# Patient Record
Sex: Male | Born: 1980 | Race: Black or African American | Hispanic: No | Smoking: Former smoker
Health system: Southern US, Community
[De-identification: ages and names within clinical notes are randomized; demographics above are authoritative.]

## PROBLEM LIST (undated history)

## (undated) DIAGNOSIS — J9383 Other pneumothorax: Secondary | ICD-10-CM

## (undated) DIAGNOSIS — J939 Pneumothorax, unspecified: Secondary | ICD-10-CM

## (undated) DIAGNOSIS — K219 Gastro-esophageal reflux disease without esophagitis: Secondary | ICD-10-CM

## (undated) HISTORY — PX: FRACTURE SURGERY: SHX138

## (undated) HISTORY — PX: MANDIBLE FRACTURE SURGERY: SHX706

---
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
## 2000-04-01 ENCOUNTER — Emergency Department (HOSPITAL_COMMUNITY): Admission: EM | Admit: 2000-04-01 | Discharge: 2000-04-01 | Payer: Self-pay | Admitting: Emergency Medicine

## 2000-04-20 ENCOUNTER — Emergency Department (HOSPITAL_COMMUNITY): Admission: EM | Admit: 2000-04-20 | Discharge: 2000-04-20 | Payer: Self-pay | Admitting: Emergency Medicine

## 2000-04-29 ENCOUNTER — Emergency Department (HOSPITAL_COMMUNITY): Admission: EM | Admit: 2000-04-29 | Discharge: 2000-04-29 | Payer: Self-pay | Admitting: Emergency Medicine

## 2000-08-06 ENCOUNTER — Emergency Department (HOSPITAL_COMMUNITY): Admission: EM | Admit: 2000-08-06 | Discharge: 2000-08-06 | Payer: Self-pay | Admitting: Emergency Medicine

## 2002-11-13 ENCOUNTER — Emergency Department (HOSPITAL_COMMUNITY): Admission: EM | Admit: 2002-11-13 | Discharge: 2002-11-13 | Payer: Self-pay | Admitting: Emergency Medicine

## 2003-02-20 ENCOUNTER — Emergency Department (HOSPITAL_COMMUNITY): Admission: EM | Admit: 2003-02-20 | Discharge: 2003-02-20 | Payer: Self-pay | Admitting: Emergency Medicine

## 2003-02-20 ENCOUNTER — Encounter: Payer: Self-pay | Admitting: Emergency Medicine

## 2005-04-17 ENCOUNTER — Emergency Department (HOSPITAL_COMMUNITY): Admission: EM | Admit: 2005-04-17 | Discharge: 2005-04-17 | Payer: Self-pay | Admitting: Emergency Medicine

## 2005-06-14 ENCOUNTER — Emergency Department (HOSPITAL_COMMUNITY): Admission: EM | Admit: 2005-06-14 | Discharge: 2005-06-15 | Payer: Self-pay | Admitting: Emergency Medicine

## 2006-03-14 ENCOUNTER — Emergency Department (HOSPITAL_COMMUNITY): Admission: EM | Admit: 2006-03-14 | Discharge: 2006-03-15 | Payer: Self-pay | Admitting: Emergency Medicine

## 2006-04-16 ENCOUNTER — Emergency Department (HOSPITAL_COMMUNITY): Admission: EM | Admit: 2006-04-16 | Discharge: 2006-04-17 | Payer: Self-pay | Admitting: Emergency Medicine

## 2007-10-29 ENCOUNTER — Emergency Department (HOSPITAL_COMMUNITY): Admission: EM | Admit: 2007-10-29 | Discharge: 2007-10-29 | Payer: Self-pay | Admitting: Emergency Medicine

## 2008-03-09 ENCOUNTER — Emergency Department (HOSPITAL_COMMUNITY): Admission: EM | Admit: 2008-03-09 | Discharge: 2008-03-09 | Payer: Self-pay | Admitting: Emergency Medicine

## 2008-04-05 ENCOUNTER — Emergency Department (HOSPITAL_COMMUNITY): Admission: EM | Admit: 2008-04-05 | Discharge: 2008-04-05 | Payer: Self-pay | Admitting: Emergency Medicine

## 2008-12-06 ENCOUNTER — Emergency Department (HOSPITAL_COMMUNITY): Admission: EM | Admit: 2008-12-06 | Discharge: 2008-12-06 | Payer: Self-pay | Admitting: Emergency Medicine

## 2009-02-21 ENCOUNTER — Emergency Department (HOSPITAL_COMMUNITY): Admission: EM | Admit: 2009-02-21 | Discharge: 2009-02-21 | Payer: Self-pay | Admitting: Emergency Medicine

## 2009-05-05 ENCOUNTER — Emergency Department (HOSPITAL_COMMUNITY): Admission: EM | Admit: 2009-05-05 | Discharge: 2009-05-05 | Payer: Self-pay | Admitting: Emergency Medicine

## 2009-05-28 ENCOUNTER — Emergency Department (HOSPITAL_COMMUNITY): Admission: EM | Admit: 2009-05-28 | Discharge: 2009-05-29 | Payer: Self-pay | Admitting: Emergency Medicine

## 2009-06-30 ENCOUNTER — Ambulatory Visit (HOSPITAL_COMMUNITY): Admission: RE | Admit: 2009-06-30 | Discharge: 2009-06-30 | Payer: Self-pay | Admitting: Otolaryngology

## 2010-02-03 ENCOUNTER — Emergency Department (HOSPITAL_COMMUNITY): Admission: EM | Admit: 2010-02-03 | Discharge: 2010-02-03 | Payer: Self-pay | Admitting: Emergency Medicine

## 2010-04-03 ENCOUNTER — Emergency Department (HOSPITAL_COMMUNITY): Admission: EM | Admit: 2010-04-03 | Discharge: 2010-04-03 | Payer: Self-pay | Admitting: Emergency Medicine
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
## 2010-10-11 ENCOUNTER — Emergency Department (HOSPITAL_COMMUNITY): Admission: EM | Admit: 2010-10-11 | Discharge: 2010-10-11 | Payer: Self-pay | Admitting: Emergency Medicine

## 2010-10-24 ENCOUNTER — Emergency Department (HOSPITAL_COMMUNITY): Admission: EM | Admit: 2010-10-24 | Discharge: 2010-10-24 | Payer: Self-pay | Admitting: Emergency Medicine

## 2011-02-28 LAB — URINE CULTURE
Culture  Setup Time: 201111072152
Culture: NO GROWTH

## 2011-02-28 LAB — URINALYSIS, ROUTINE W REFLEX MICROSCOPIC
Bilirubin Urine: NEGATIVE
Glucose, UA: NEGATIVE mg/dL
Protein, ur: NEGATIVE mg/dL
Urobilinogen, UA: 0.2 mg/dL (ref 0.0–1.0)

## 2011-02-28 LAB — URINE MICROSCOPIC-ADD ON

## 2011-03-08 LAB — URINALYSIS, ROUTINE W REFLEX MICROSCOPIC
Glucose, UA: NEGATIVE mg/dL
Hgb urine dipstick: NEGATIVE
Ketones, ur: NEGATIVE mg/dL
Protein, ur: NEGATIVE mg/dL
pH: 7 (ref 5.0–8.0)

## 2011-03-08 LAB — RAPID URINE DRUG SCREEN, HOSP PERFORMED
Amphetamines: NOT DETECTED
Barbiturates: NOT DETECTED
Benzodiazepines: NOT DETECTED
Cocaine: POSITIVE — AB

## 2011-03-26 LAB — CBC
MCHC: 33.7 g/dL (ref 30.0–36.0)
Platelets: 159 10*3/uL (ref 150–400)
RDW: 13.9 % (ref 11.5–15.5)

## 2011-03-26 LAB — BASIC METABOLIC PANEL
CO2: 29 mEq/L (ref 19–32)
Calcium: 9.9 mg/dL (ref 8.4–10.5)
Creatinine, Ser: 1.11 mg/dL (ref 0.4–1.5)
GFR calc non Af Amer: >60 mL/min (ref >60)
Glucose, Bld: 83 mg/dL (ref 70–99)

## 2011-05-02 NOTE — Op Note (Signed)
NAMEShauna Price        ACCOUNT NO.:  [id number]   MEDICAL RECORD NO.:  [id number]          PATIENT TYPE:  AMB   LOCATION:  SDS                          FACILITY:  [facility]   PHYSICIAN:  Newman Pies, MD            DATE OF BIRTH:  08/18/1981   DATE OF PROCEDURE:  06/30/2009  DATE OF DISCHARGE:  06/30/2009                               OPERATIVE REPORT   SURGEON:  Newman Pies, MD   PREOPERATIVE DIAGNOSIS:  Mandibular fracture, status post mandibular  maxillary fixation.   POSTOPERATIVE DIAGNOSIS:  Mandibular fracture, status post mandibular  maxillary fixation.   PROCEDURE PERFORMED:  Removal of mandibular maxillary fixation screws  and wires.   ANESTHESIA:  Local anesthesia with IV conscious sedation.   COMPLICATIONS:  None.   ESTIMATED BLOOD LOSS:  Minimal.   INDICATIONS FOR PROCEDURE:  The patient is a 30 year old male with a  history of mandibular fracture approximately 5 weeks ago.  He  subsequently underwent maxillary mandibular fixation to treat his  mandibular fracture.  The patient is taken back to the operating room  today for removal of the MMF screws and wires.  The risks, benefits, and  details of the procedure were discussed with the patient.  Questions  were invited and answered.  Informed consent was obtained.   DESCRIPTION OF PROCEDURE:  The patient was taken to the operating room  and placed supine on the operating table.  IV conscious sedation was  administered by the anesthesiologist.  Lidocaine 1% with 1:100,000  epinephrine were injected into the soft tissue around 4 MMF screws.  The  MMF wires were subsequently cut and removed with a wire driver.  Bovie  electrocautery was used to remove the soft tissue over the MMF screws.  The screws were subsequently removed without difficulty.  Good dental  occlusion was noted.  That concluded procedure for the patient.  The  care of the patient was turned over to the anesthesiologist.  The  patient was awakened  from anesthesia without difficulty.  He was  transferred to the recovery room in good condition.   OPERATIVE FINDINGS:  The MMF screws and wires were noted to be in place.  Good dental occlusion was noted.   SPECIMEN:  None.   FOLLOWUP CARE:  The patient will be discharged home once he is awake and  alert.  He will follow up in my office on a p.r.n. basis.      Newman Pies, MD  Electronically Signed     ST/MEDQ  D:  06/30/2009  T:  07/01/2009  Job:  [id number]
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
## 2011-09-11 LAB — URINALYSIS, ROUTINE W REFLEX MICROSCOPIC
Bilirubin Urine: NEGATIVE
Glucose, UA: NEGATIVE
Ketones, ur: 15 — AB
Nitrite: NEGATIVE
Protein, ur: 30 — AB
Specific Gravity, Urine: 1.029
Urobilinogen, UA: 0.2
pH: 5.5

## 2011-09-11 LAB — URINE MICROSCOPIC-ADD ON

## 2011-09-11 LAB — POCT I-STAT, CHEM 8
Creatinine, Ser: 1.4
Glucose, Bld: 89
HCT: 50
Hemoglobin: 17
Sodium: 140
TCO2: 30

## 2011-09-11 LAB — CBC
HCT: 47.2
Hemoglobin: 15.5
MCHC: 32.9
MCV: 86.3
Platelets: 186
RBC: 5.47
RDW: 13.7
WBC: 7.6

## 2011-09-11 LAB — DIFFERENTIAL
Basophils Absolute: 0
Basophils Relative: 1
Eosinophils Absolute: 0.1
Eosinophils Relative: 1
Lymphocytes Relative: 31
Lymphs Abs: 2.4
Monocytes Absolute: 1.2 — ABNORMAL HIGH
Monocytes Relative: 15 — ABNORMAL HIGH
Neutro Abs: 3.9
Neutrophils Relative %: 52

## 2011-09-11 LAB — GC/CHLAMYDIA PROBE AMP, GENITAL: Chlamydia, DNA Probe: NEGATIVE

## 2011-09-21 LAB — HEPATIC FUNCTION PANEL
AST: 20 U/L (ref 0–37)
Albumin: 4.1 g/dL (ref 3.5–5.2)
Alkaline Phosphatase: 75 U/L (ref 39–117)
Total Bilirubin: 1.4 mg/dL — ABNORMAL HIGH (ref 0.3–1.2)

## 2011-09-21 LAB — POCT I-STAT, CHEM 8
BUN: 10 mg/dL (ref 6–23)
Calcium, Ion: 1.16 mmol/L (ref 1.12–1.32)
Creatinine, Ser: 1.2 mg/dL (ref 0.4–1.5)
Glucose, Bld: 87 mg/dL (ref 70–99)
TCO2: 27 mmol/L (ref 0–100)

## 2011-09-21 LAB — URINALYSIS, ROUTINE W REFLEX MICROSCOPIC
Bilirubin Urine: NEGATIVE
Glucose, UA: NEGATIVE mg/dL
Ketones, ur: NEGATIVE mg/dL
pH: 7 (ref 5.0–8.0)

## 2011-09-21 LAB — DIFFERENTIAL
Lymphocytes Relative: 15 % (ref 12–46)
Lymphs Abs: 2.3 10*3/uL (ref 0.7–4.0)
Monocytes Relative: 6 % (ref 3–12)
Neutro Abs: 11.5 10*3/uL — ABNORMAL HIGH (ref 1.7–7.7)
Neutrophils Relative %: 77 % (ref 43–77)

## 2011-09-21 LAB — CBC
Hemoglobin: 15.1 g/dL (ref 13.0–17.0)
MCV: 86.6 fL (ref 78.0–100.0)
Platelets: 207 10*3/uL (ref 150–400)

## 2011-09-21 LAB — LIPASE, BLOOD: Lipase: 18 U/L (ref 11–59)

## 2011-12-06 ENCOUNTER — Encounter: Payer: Self-pay | Admitting: Emergency Medicine

## 2011-12-06 ENCOUNTER — Emergency Department (HOSPITAL_COMMUNITY)
Admission: EM | Admit: 2011-12-06 | Discharge: 2011-12-07 | Disposition: A | Discharge status: Home / Self Care | Payer: Self-pay | Attending: Emergency Medicine | Admitting: Emergency Medicine

## 2011-12-06 DIAGNOSIS — J020 Streptococcal pharyngitis: Secondary | ICD-10-CM | POA: Insufficient documentation

## 2011-12-06 NOTE — ED Notes (Signed)
Pt. Received from triage, pt. Alert and oriented, ambulatory gait steady, NAD noted

## 2011-12-06 NOTE — ED Notes (Signed)
Pt c/o sore throat and elevated temp onset last pm

## 2011-12-07 MED ORDER — FENTANYL CITRATE 0.05 MG/ML IJ SOLN: 50.0000 ug | Freq: Once | INTRAMUSCULAR | Status: DC

## 2011-12-07 MED ORDER — IBUPROFEN 600 MG PO TABS: 600.0000 mg | ORAL_TABLET | Freq: Four times a day (QID) | ORAL | Status: AC | PRN

## 2011-12-07 MED ORDER — PENICILLIN G BENZATHINE 1200000 UNIT/2ML IM SUSP
1.2000 10*6.[IU] | INTRAMUSCULAR | Status: AC
  Administered 2011-12-07: 1.2 10*6.[IU] via INTRAMUSCULAR
  Filled 2011-12-07: qty 2

## 2011-12-07 NOTE — ED Provider Notes (Signed)
History     CSN: [id number] Arrival date & time: 12/06/2011  9:08 PM   First MD Initiated Contact with Patient 12/06/11 2320      Chief Complaint  Patient presents with  . Sore Throat    (Consider location/radiation/quality/duration/timing/severity/associated sxs/prior treatment) Patient is a 30 y.o. male presenting with pharyngitis. The history is provided by the patient. No language interpreter was used.  Sore Throat This is a new problem. The current episode started 2 days ago. The problem occurs constantly. The problem has not changed since onset.Pertinent negatives include no chest pain, no abdominal pain, no headaches and no shortness of breath. The symptoms are aggravated by nothing. The symptoms are relieved by nothing. He has tried nothing for the symptoms. The treatment provided no relief.  Exposed to people with strep throat.  No f/c/r.  No CP, SOB n/v/d.  No drooling no change in voice  History reviewed. No pertinent past medical history.  Past Surgical History  Procedure Date  . Mandible fracture surgery     No family history on file.  History  Substance Use Topics  . Smoking status: Passive Smoker    Types: Cigarettes  . Smokeless tobacco: Not on file  . Alcohol Use: Yes     occasional      Review of Systems  Constitutional: Negative for fever and activity change.  HENT: Positive for sore throat. Negative for rhinorrhea, drooling, neck pain, neck stiffness and voice change.   Eyes: Negative for discharge.  Respiratory: Negative for shortness of breath.   Cardiovascular: Negative for chest pain.  Gastrointestinal: Negative for abdominal pain and abdominal distention.  Genitourinary: Negative for difficulty urinating.  Musculoskeletal: Negative.   Skin: Negative.   Neurological: Negative for headaches.  Hematological: Negative.   Psychiatric/Behavioral: Negative.  Negative for agitation.    Allergies  Review of patient's allergies indicates no known  allergies.  Home Medications  No current outpatient prescriptions on file.  BP 126/81  Pulse 93  Temp(Src) 99.6 F (37.6 C) (Oral)  Resp 18  SpO2 98%  Physical Exam  Constitutional: He is oriented to person, place, and time. He appears well-developed and well-nourished. No distress.  HENT:  Head: Normocephalic and atraumatic.  Right Ear: Tympanic membrane is not injected.  Left Ear: Tympanic membrane is not injected.  Nose: Nose normal.  Mouth/Throat: Oropharyngeal exudate present.  Eyes: EOM are normal. Pupils are equal, round, and reactive to light.  Neck: Normal range of motion. Neck supple. No thyromegaly present.  Cardiovascular: Normal rate.   Pulmonary/Chest: Effort normal and breath sounds normal.  Abdominal: Soft. Bowel sounds are normal. There is no tenderness. There is no rebound.  Musculoskeletal: Normal range of motion.  Neurological: He is alert and oriented to person, place, and time.  Skin: Skin is warm and dry.  Psychiatric: Thought content normal.    ED Course  Procedures (including critical care time)  Labs Reviewed  RAPID STREP SCREEN - Abnormal; Notable for the following:    Streptococcus, Group A Screen (Direct) POSITIVE (*)    All other components within normal limits   No results found.   No diagnosis found.    MDM  Return for drooling change in voice inability to tolerate orals.        Jasmine Awe, MD 12/07/11 [phone]

## 2011-12-07 NOTE — ED Notes (Signed)
Pt. Discharged to home, pt. Alert and oriented, ambulatory gait steady, NAD noted

## 2012-04-30 ENCOUNTER — Encounter (HOSPITAL_COMMUNITY): Payer: Self-pay | Admitting: *Deleted

## 2012-04-30 ENCOUNTER — Emergency Department (HOSPITAL_COMMUNITY)
Admission: EM | Admit: 2012-04-30 | Discharge: 2012-04-30 | Disposition: A | Discharge status: Home / Self Care | Payer: Self-pay | Attending: Emergency Medicine | Admitting: Emergency Medicine

## 2012-04-30 DIAGNOSIS — L723 Sebaceous cyst: Secondary | ICD-10-CM | POA: Insufficient documentation

## 2012-04-30 DIAGNOSIS — R059 Cough, unspecified: Secondary | ICD-10-CM | POA: Insufficient documentation

## 2012-04-30 DIAGNOSIS — J3489 Other specified disorders of nose and nasal sinuses: Secondary | ICD-10-CM | POA: Insufficient documentation

## 2012-04-30 DIAGNOSIS — H9209 Otalgia, unspecified ear: Secondary | ICD-10-CM | POA: Insufficient documentation

## 2012-04-30 DIAGNOSIS — F17200 Nicotine dependence, unspecified, uncomplicated: Secondary | ICD-10-CM | POA: Insufficient documentation

## 2012-04-30 DIAGNOSIS — R05 Cough: Secondary | ICD-10-CM | POA: Insufficient documentation

## 2012-04-30 DIAGNOSIS — J029 Acute pharyngitis, unspecified: Secondary | ICD-10-CM | POA: Insufficient documentation

## 2012-04-30 DIAGNOSIS — J069 Acute upper respiratory infection, unspecified: Secondary | ICD-10-CM | POA: Insufficient documentation

## 2012-04-30 MED ORDER — PSEUDOEPHEDRINE HCL 60 MG PO TABS: 60.0000 mg | ORAL_TABLET | Freq: Once | ORAL | Status: AC

## 2012-04-30 MED ORDER — KETOROLAC TROMETHAMINE 30 MG/ML IJ SOLN
30.0000 mg | Freq: Once | INTRAMUSCULAR | Status: AC
  Administered 2012-04-30: 30 mg via INTRAMUSCULAR
  Filled 2012-04-30: qty 1

## 2012-04-30 MED ORDER — PSEUDOEPHEDRINE HCL 60 MG PO TABS
60.0000 mg | ORAL_TABLET | Freq: Once | ORAL | Status: AC
  Administered 2012-04-30: 60 mg via ORAL
  Filled 2012-04-30: qty 1

## 2012-04-30 NOTE — Discharge Instructions (Signed)
Saline Nose Drops  To help clear a stuffy nose, put salt water (saline) nose drops in your infant's nose. This helps to loosen the secretions in the nose. Use a bulb syringe to clean the nose out:  Before feeding.   Before putting your infant down for naps.   No more than once every 3 hours to avoid irritating your infant's nostrils.  HOME CARE  Buy nose drops at your local drug store. You can also make nose drops yourself. Mix 1 cup of water with  teaspoon of salt. Stir. Store this mixture at room temperature. Make a new batch daily.   To use the drops:   Put 1 or 2 drops in each side of infant's nose with a clean medicine dropper. Do not use this dropper for any other medicine.   Squeeze the air out of the suction bulb before inserting it into your infant's nose.   While still squeezing the bulb flat, place the tip of the bulb into a nostril. Let air come back into the bulb. The suction will pull snot out of the nose and into the bulb.   Repeat on other nostril.   Squeeze the bulb several times into a tissue and wash the bulb tip in soapy water. Store the bulb with the tip side down on paper towel.   Use the bulb syringe with only the saline drops to avoid irritating your infant's nostrils.  GET HELP RIGHT AWAY IF:  The snot changes to green or yellow.   The snot gets thicker.   Your infant is 3 months or younger with a rectal temperature of 100.4 F (38 C) or higher.   Your infant is older than 3 months with a rectal temperature of 102 F (38.9 C) or higher.   The stuffy nose lasts 10 days or longer.   There is trouble breathing or feeding.  MAKE SURE YOU:  Understand these instructions.   Will watch your infant's condition.   Will get help right away if your infant is not doing well or gets worse.  Document Released: 10/01/2009 Document Revised: 11/23/2011 Document Reviewed: 10/01/2009 [facility] Patient Information 2012 [place], [place]. You've been given a  prescription for a decongestant he can take Tylenol or ibuprofen alternating doses every 3-4 hours for fever or for

## 2012-04-30 NOTE — ED Notes (Addendum)
C/o nasal congestion, earaches, runny nose, sore throat, chest hurts, cough (non-productive), facial pressure, onset of sx last night. describes as cold sx. No relief with nyquil. (Denies: fever or nvd). Using kleenex and snorting to scratch throat. Alert, NAD, calm, interactive. LS CTA, throat mildly red and swollen, no obvious exudate.

## 2012-04-30 NOTE — ED Provider Notes (Signed)
History     CSN: [id number]  Arrival date & time 04/30/12  4098   First MD Initiated Contact with Patient 04/30/12 2021      Chief Complaint  Patient presents with  . Nasal Congestion    (Consider location/radiation/quality/duration/timing/severity/associated sxs/prior treatment) HPI Comments: Exercise last night with myalgias, sinus congestion, sore throat, nonproductive cough, earache, he is taken NyQuil without relief.  Denies any documented fever.  He also has a "bump on his left distal clavicle" for the last 5+ years.  That is now becoming painful  The history is provided by the patient.    History reviewed. No pertinent past medical history.  Past Surgical History  Procedure Date  . Mandible fracture surgery     No family history on file.  History  Substance Use Topics  . Smoking status: Current Some Day Smoker    Types: Cigarettes  . Smokeless tobacco: Not on file  . Alcohol Use: Yes     occasional      Review of Systems  Constitutional: Negative for fever and chills.  HENT: Positive for ear pain, congestion, sore throat and trouble swallowing. Negative for rhinorrhea and postnasal drip.   Respiratory: Positive for cough. Negative for shortness of breath and wheezing.   Skin: Positive for wound.  Neurological: Negative for dizziness and numbness.    Allergies  Review of patient's allergies indicates no known allergies.  Home Medications   Current Outpatient Rx  Name Route Sig Dispense Refill  . BENADRYL PO Oral Take 1 tablet by mouth daily.    Marland Kitchen PSEUDOEPHEDRINE HCL 60 MG PO TABS Oral Take 1 tablet (60 mg total) by mouth once. 30 tablet 0    BP 139/88  Pulse 94  Temp 99.3 F (37.4 C)  Resp 16  SpO2 96%  Physical Exam  Constitutional: He is oriented to person, place, and time. He appears well-developed and well-nourished.  HENT:  Head: Normocephalic.  Nose: No rhinorrhea. Right sinus exhibits maxillary sinus tenderness and frontal sinus  tenderness. Left sinus exhibits maxillary sinus tenderness and frontal sinus tenderness.  Eyes: Pupils are equal, round, and reactive to light.  Neck: Normal range of motion.  Cardiovascular: Normal rate.   Pulmonary/Chest: Effort normal. No respiratory distress. He has no wheezes.  Neurological: He is alert and oriented to person, place, and time.  Skin:       Small mobile, skin cyst at the apex of the left clavicle at the a.c. joint without surrounding erythema.  Not tender    ED Course  Procedures (including critical care time)  Labs Reviewed - No data to display No results found.   1. Upper respiratory tract infection       MDM  Upper respiratory tract infection        Arman Filter, NP 04/30/12 2107  Arman Filter, NP 05/01/12 [phone]

## 2012-05-03 NOTE — ED Provider Notes (Signed)
Medical screening examination/treatment/procedure(s) were performed by non-physician practitioner and as supervising physician I was immediately available for consultation/collaboration.   Leigh-Ann Evaan Tidwell, MD 05/03/12 0139 

## 2012-09-10 ENCOUNTER — Emergency Department (HOSPITAL_COMMUNITY)
Admission: EM | Admit: 2012-09-10 | Discharge: 2012-09-10 | Disposition: A | Discharge status: Home / Self Care | Payer: Self-pay | Attending: Emergency Medicine | Admitting: Emergency Medicine

## 2012-09-10 ENCOUNTER — Encounter (HOSPITAL_COMMUNITY): Payer: Self-pay | Admitting: Emergency Medicine

## 2012-09-10 DIAGNOSIS — Y93E1 Activity, personal bathing and showering: Secondary | ICD-10-CM | POA: Insufficient documentation

## 2012-09-10 DIAGNOSIS — Z91018 Allergy to other foods: Secondary | ICD-10-CM | POA: Insufficient documentation

## 2012-09-10 DIAGNOSIS — F17200 Nicotine dependence, unspecified, uncomplicated: Secondary | ICD-10-CM | POA: Insufficient documentation

## 2012-09-10 DIAGNOSIS — S0100XA Unspecified open wound of scalp, initial encounter: Secondary | ICD-10-CM

## 2012-09-10 DIAGNOSIS — IMO0002 Reserved for concepts with insufficient information to code with codable children: Secondary | ICD-10-CM | POA: Insufficient documentation
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
## 2012-09-10 NOTE — Discharge Instructions (Signed)
 Your laceration is too small to require stitches,  Keep clean with soap and water is adequate to help prevent infection.

## 2012-09-10 NOTE — ED Notes (Signed)
Pt has small head lac with no bleeding.  No LOC.  Pt denies any problems at this time.

## 2012-09-10 NOTE — ED Notes (Signed)
Pt was leaning into tub and hit head on facet. Pt has small laceration to top of head. Pt denies LOC. Pt AAOx 4.

## 2012-11-19 ENCOUNTER — Emergency Department (HOSPITAL_COMMUNITY)
Admission: EM | Admit: 2012-11-19 | Discharge: 2012-11-19 | Disposition: A | Discharge status: Home / Self Care | Payer: Self-pay | Attending: Emergency Medicine | Admitting: Emergency Medicine

## 2012-11-19 ENCOUNTER — Encounter (HOSPITAL_COMMUNITY): Payer: Self-pay | Admitting: *Deleted

## 2012-11-19 DIAGNOSIS — F17200 Nicotine dependence, unspecified, uncomplicated: Secondary | ICD-10-CM | POA: Insufficient documentation

## 2012-11-19 DIAGNOSIS — R300 Dysuria: Secondary | ICD-10-CM | POA: Insufficient documentation

## 2012-11-19 DIAGNOSIS — R369 Urethral discharge, unspecified: Secondary | ICD-10-CM | POA: Insufficient documentation

## 2012-11-19 DIAGNOSIS — Z8619 Personal history of other infectious and parasitic diseases: Secondary | ICD-10-CM | POA: Insufficient documentation

## 2012-11-19 LAB — URINALYSIS, ROUTINE W REFLEX MICROSCOPIC
Bilirubin Urine: NEGATIVE
Glucose, UA: NEGATIVE mg/dL
Hgb urine dipstick: NEGATIVE
Ketones, ur: NEGATIVE mg/dL
pH: 6 (ref 5.0–8.0)

## 2012-11-19 LAB — URINE MICROSCOPIC-ADD ON

## 2012-11-19 MED ORDER — AZITHROMYCIN 250 MG PO TABS
1000.0000 mg | ORAL_TABLET | Freq: Once | ORAL | Status: AC
  Administered 2012-11-19: 1000 mg via ORAL
  Filled 2012-11-19: qty 4

## 2012-11-19 MED ORDER — LIDOCAINE HCL (PF) 1 % IJ SOLN
INTRAMUSCULAR | Status: AC
  Administered 2012-11-19: 0.9 mL
  Filled 2012-11-19: qty 5

## 2012-11-19 MED ORDER — CEFTRIAXONE SODIUM 250 MG IJ SOLR
250.0000 mg | Freq: Once | INTRAMUSCULAR | Status: AC
  Administered 2012-11-19: 250 mg via INTRAMUSCULAR
  Filled 2012-11-19: qty 250

## 2012-11-19 NOTE — ED Notes (Signed)
Pt is here with lower back pain and feeling funny around private area.  Penile discharge beige in color.  Pain with urination

## 2012-11-19 NOTE — ED Notes (Signed)
Pt left before given d/c papers and did not sign.

## 2012-11-19 NOTE — ED Provider Notes (Signed)
History    This chart was scribed for Gerhard Munch, MD, MD by Smitty Pluck, ED Scribe. The patient was seen in room TR09C and the patient's care was started at 11:24AM.   CSN: [id number]  Arrival date & time 11/19/12  1021       Chief Complaint  Patient presents with  . Back Pain    (Consider location/radiation/quality/duration/timing/severity/associated sxs/prior treatment) The history is provided by the patient. No language interpreter was used.   Colton Price is a 31 y.o. male who presents to the Emergency Department complaining of constant, moderate dysuria onset 1 day ago. Pt reports that he has beige discharge from penis and intermittent moderate lower back pain onset 3 days ago. He denies injury to back and lifting heavy objects. He reports having gonorrheal infection years ago. He denies multiple sexual partners. Pt denies fever, chills, nausea, vomiting, abdominal pain, headache, SOB, chest pain and any other symptoms.    History reviewed. No pertinent past medical history.  Past Surgical History  Procedure Date  . Mandible fracture surgery     No family history on file.  History  Substance Use Topics  . Smoking status: Current Some Day Smoker    Types: Cigarettes  . Smokeless tobacco: Not on file  . Alcohol Use: Yes     Comment: occasional      Review of Systems  Constitutional:       Per HPI, otherwise negative  HENT:       Per HPI, otherwise negative  Eyes: Negative.   Respiratory:       Per HPI, otherwise negative  Cardiovascular:       Per HPI, otherwise negative  Gastrointestinal: Negative for vomiting.  Genitourinary: Negative.   Musculoskeletal:       Per HPI, otherwise negative  Skin: Negative.   Neurological: Negative for syncope.    Allergies  Fish allergy and Fruit & vegetable daily  Home Medications   Current Outpatient Rx  Name  Route  Sig  Dispense  Refill  . ASPIRIN EC 81 MG PO TBEC   Oral   Take 162 mg by mouth  daily as needed. For headache           BP 126/76  Pulse 69  Temp 97.3 F (36.3 C) (Oral)  Resp 18  SpO2 98%  Physical Exam  Nursing note and vitals reviewed. Constitutional: He is oriented to person, place, and time. He appears well-developed and well-nourished. No distress.  HENT:  Head: Normocephalic and atraumatic.  Eyes: EOM are normal.  Neck: Neck supple. No tracheal deviation present.  Cardiovascular: Normal rate, regular rhythm and normal heart sounds.   Pulmonary/Chest: Effort normal and breath sounds normal. No respiratory distress.  Genitourinary:       Whitish discharge    Musculoskeletal: Normal range of motion.       No back tenderness to palpation   Lymphadenopathy:       Right: Inguinal adenopathy present.       Left: Inguinal adenopathy present.  Neurological: He is alert and oriented to person, place, and time.  Skin: Skin is warm and dry.  Psychiatric: He has a normal mood and affect. His behavior is normal.    ED Course  Procedures (including critical care time) DIAGNOSTIC STUDIES:   COORDINATION OF CARE: 11:30 AM Discussed ED treatment with pt     Labs Reviewed  URINALYSIS, ROUTINE W REFLEX MICROSCOPIC - Abnormal; Notable for the following:    APPearance CLOUDY (*)  Leukocytes, UA MODERATE (*)     All other components within normal limits  URINE MICROSCOPIC-ADD ON   No results found.   No diagnosis found.    MDM  I personally performed the services described in this documentation, which was scribed in my presence. The recorded information has been reviewed and is accurate.  This generally well-appearing young male presents with concerns of back pain, urethral discharge, dysuria.  UA positive for leukocytes. Given this history he was treated empirically for gonorrhea and chlamydia.  A culture was sent.  Absent concerning VS changes, distress, he was appropriate for D/C.  He was counselled on the need to have his partner evaluated /  treated as well.   Gerhard Munch, MD 11/19/12 1145

## 2012-11-19 NOTE — ED Notes (Signed)
Left before d/c paperwork given and did not sign.

## 2012-11-20 LAB — GC/CHLAMYDIA PROBE AMP
CT Probe RNA: POSITIVE — AB
GC Probe RNA: POSITIVE — AB

## 2012-11-23 ENCOUNTER — Telehealth (HOSPITAL_COMMUNITY): Payer: Self-pay | Admitting: Emergency Medicine

## 2012-11-23 NOTE — ED Notes (Signed)
+  Gonorrhea. +Chlamydia. Patient treated with Rocephin and Zithromax. Per protocol MD. [facility] faxed. °

## 2012-11-24 ENCOUNTER — Telehealth (HOSPITAL_COMMUNITY): Payer: Self-pay | Admitting: Emergency Medicine

## 2012-11-24 NOTE — ED Notes (Signed)
Unable to contact patient via phone. Sent letter. °

## 2012-12-08 NOTE — ED Notes (Signed)
Patient treated per protocol. Chart sent to Medical Records. °

## 2013-04-05 ENCOUNTER — Emergency Department (HOSPITAL_COMMUNITY)
Admission: EM | Admit: 2013-04-05 | Discharge: 2013-04-05 | Disposition: A | Discharge status: Home / Self Care | Payer: Self-pay | Attending: Emergency Medicine | Admitting: Emergency Medicine

## 2013-04-05 ENCOUNTER — Encounter (HOSPITAL_COMMUNITY): Payer: Self-pay | Admitting: Neurology

## 2013-04-05 DIAGNOSIS — Z8719 Personal history of other diseases of the digestive system: Secondary | ICD-10-CM | POA: Insufficient documentation

## 2013-04-05 DIAGNOSIS — R110 Nausea: Secondary | ICD-10-CM | POA: Insufficient documentation

## 2013-04-05 DIAGNOSIS — F17200 Nicotine dependence, unspecified, uncomplicated: Secondary | ICD-10-CM | POA: Insufficient documentation

## 2013-04-05 DIAGNOSIS — J029 Acute pharyngitis, unspecified: Secondary | ICD-10-CM | POA: Insufficient documentation

## 2013-04-05 DIAGNOSIS — M542 Cervicalgia: Secondary | ICD-10-CM | POA: Insufficient documentation

## 2013-04-05 DIAGNOSIS — R1310 Dysphagia, unspecified: Secondary | ICD-10-CM | POA: Insufficient documentation

## 2013-04-05 DIAGNOSIS — J020 Streptococcal pharyngitis: Secondary | ICD-10-CM

## 2013-04-05 HISTORY — DX: Gastro-esophageal reflux disease without esophagitis: K21.9

## 2013-04-05 MED ORDER — ONDANSETRON HCL 4 MG PO TABS: 4.0000 mg | ORAL_TABLET | Freq: Four times a day (QID) | ORAL | Status: DC

## 2013-04-05 MED ORDER — HYDROCODONE-ACETAMINOPHEN 7.5-325 MG/15ML PO SOLN: 15.0000 mL | Freq: Four times a day (QID) | ORAL | Status: DC | PRN

## 2013-04-05 MED ORDER — PENICILLIN G BENZATHINE 1200000 UNIT/2ML IM SUSP
1.2000 10*6.[IU] | Freq: Once | INTRAMUSCULAR | Status: AC
  Administered 2013-04-05: 1.2 10*6.[IU] via INTRAMUSCULAR
  Filled 2013-04-05: qty 2

## 2013-04-05 MED ORDER — HYDROCODONE-ACETAMINOPHEN 7.5-325 MG/15ML PO SOLN
10.0000 mL | Freq: Once | ORAL | Status: DC
  Filled 2013-04-05: qty 15

## 2013-04-05 NOTE — ED Notes (Addendum)
Pt stating sore throat x 2 days, nausea since yesterday. Airway intact. Reports pain with swallowing and diminished appetite. A x 4. Reporting took 2 excedrin today. Denies v/d.

## 2013-04-05 NOTE — ED Provider Notes (Signed)
History     CSN: [id number]  Arrival date & time 04/05/13  1311   First MD Initiated Contact with Patient 04/05/13 1444      Chief Complaint  Patient presents with  . Sore Throat  . Nausea    (Consider location/radiation/quality/duration/timing/severity/associated sxs/prior treatment) HPI  32 year old male presents complaining of sore throat. Patient reports gradual onset of soreness to the right side of his throat ongoing for the past 2 days. Pain has been persistent, 8/10, worse with swallowing, with associate nausea without vomiting or diarrhea. He denies any fever, chills, runny nose, sneezing, cough, chest pain, shortness of breath, abdominal pain, or rash. He has prior history of strep test and this felt similar. No specific treatment at home. Able to swallow his saliva.  Past Medical History  Diagnosis Date  . GERD (gastroesophageal reflux disease)     Past Surgical History  Procedure Laterality Date  . Mandible fracture surgery      No family history on file.  History  Substance Use Topics  . Smoking status: Current Some Day Smoker    Types: Cigarettes  . Smokeless tobacco: Not on file  . Alcohol Use: Yes     Comment: occasional      Review of Systems  Constitutional: Negative for fever and chills.  HENT: Positive for sore throat, trouble swallowing and neck pain. Negative for ear pain, congestion, sneezing, dental problem and voice change.   Skin: Negative for rash.  Neurological: Negative for headaches.    Allergies  Fish allergy and Fruit & vegetable daily  Home Medications   Current Outpatient Rx  Name  Route  Sig  Dispense  Refill  . Aspirin-Acetaminophen-Caffeine (EXCEDRIN PO)   Oral   Take 2 tablets by mouth daily as needed. For pain           BP 124/82  Pulse 83  Temp(Src) 98.5 F (36.9 C) (Oral)  Resp 20  SpO2 96%  Physical Exam  Nursing note and vitals reviewed. Constitutional: He appears well-developed and well-nourished. No  distress.  HENT:  Head: Atraumatic.  Right Ear: External ear normal.  Left Ear: External ear normal.  Mouth/Throat: Uvula is midline and oropharynx is clear and moist.  Uvula midline  Bilateral tonsillar enlargement with exudates, R>L No obvious evidence of PTA, Ludwigs angina or other deep tissue infection.    Neck: Normal range of motion. Neck supple.  Lymphadenopathy:    He has cervical adenopathy.  Neurological: He is alert.  Skin: Skin is warm. No rash noted.    ED Course  Procedures (including critical care time)  3:41 PM Pt presents with sore throat.  Strep is positive.  No evidence of peritonsillar abscess or other evidence of deep tissue infection.  abx given, pain medication given, pt able to tolerates PO.  ENT referral as needed.  Return precaution discussed.   Labs Reviewed  RAPID STREP SCREEN - Abnormal; Notable for the following:    Streptococcus, Group A Screen (Direct) POSITIVE (*)    All other components within normal limits   No results found.   1. Strep pharyngitis       MDM  BP 130/86  Pulse 67  Temp(Src) 98.5 F (36.9 C) (Oral)  Resp 16  SpO2 100%  I have reviewed nursing notes and vital signs.  I reviewed available ER/hospitalization records thought the EMR         Fayrene Helper, [place] 04/05/13 1543

## 2013-04-11 NOTE — ED Provider Notes (Signed)
Medical screening examination/treatment/procedure(s) were performed by non-physician practitioner and as supervising physician I was immediately available for consultation/collaboration.  Damariz Paganelli M Mlissa Tamayo, MD 04/11/13 0021 

## 2013-06-22 ENCOUNTER — Encounter (HOSPITAL_COMMUNITY): Payer: Self-pay | Admitting: Emergency Medicine

## 2013-06-22 ENCOUNTER — Emergency Department (HOSPITAL_COMMUNITY)
Admission: EM | Admit: 2013-06-22 | Discharge: 2013-06-22 | Disposition: A | Discharge status: Home / Self Care | Payer: Self-pay | Attending: Emergency Medicine | Admitting: Emergency Medicine

## 2013-06-22 DIAGNOSIS — D1739 Benign lipomatous neoplasm of skin and subcutaneous tissue of other sites: Secondary | ICD-10-CM | POA: Insufficient documentation

## 2013-06-22 DIAGNOSIS — Z8719 Personal history of other diseases of the digestive system: Secondary | ICD-10-CM | POA: Insufficient documentation

## 2013-06-22 DIAGNOSIS — L02811 Cutaneous abscess of head [any part, except face]: Secondary | ICD-10-CM

## 2013-06-22 DIAGNOSIS — M542 Cervicalgia: Secondary | ICD-10-CM | POA: Insufficient documentation

## 2013-06-22 DIAGNOSIS — Z8781 Personal history of (healed) traumatic fracture: Secondary | ICD-10-CM | POA: Insufficient documentation

## 2013-06-22 DIAGNOSIS — L02818 Cutaneous abscess of other sites: Secondary | ICD-10-CM | POA: Insufficient documentation

## 2013-06-22 DIAGNOSIS — F17200 Nicotine dependence, unspecified, uncomplicated: Secondary | ICD-10-CM | POA: Insufficient documentation

## 2013-06-22 NOTE — ED Provider Notes (Signed)
History  This chart was scribed for non-physician practitioner working with [name]. [name], MD by Greggory Stallion, ED scribe. This patient was seen in room TR10C/TR10C and the patient's care was started at 8:13 PM.  CSN: [id number] Arrival date & time 06/22/13  1945   Chief Complaint  Patient presents with  . Abscess   The history is provided by the patient. No language interpreter was used.    HPI Comments: Colton Price is a 32 y.o. male who presents to the Emergency Department complaining of an abscess to the back of his scalp that started a one week ago. He states the pain from it radiated to his neck. Pt states there has been slight drainage for several days. Pt states history of previous cysts. Right upper back not a cyst, but a soft fluctuant mass presenting as a lipoma. Pt denies fever and emesis as associated symptoms.   Past Medical History  Diagnosis Date  . GERD (gastroesophageal reflux disease)    Past Surgical History  Procedure Laterality Date  . Mandible fracture surgery     No family history on file. History  Substance Use Topics  . Smoking status: Current Some Day Smoker    Types: Cigarettes  . Smokeless tobacco: Not on file  . Alcohol Use: Yes     Comment: occasional    Review of Systems  Constitutional: Negative for diaphoresis.  HENT: Positive for neck pain. Negative for neck stiffness.   Eyes: Negative for visual disturbance.  Respiratory: Negative for apnea, chest tightness and shortness of breath.   Cardiovascular: Negative for chest pain and palpitations.  Gastrointestinal: Negative for diarrhea and constipation.  Genitourinary: Negative for dysuria.  Musculoskeletal: Negative for gait problem.  Skin:       Cyst to back of head, lipoma to right scapula  Neurological: Negative for dizziness, weakness, light-headedness and numbness.    Allergies  Fish allergy and Fruit & vegetable daily  Home Medications   Current Outpatient Rx   Name  Route  Sig  Dispense  Refill  . Aspirin-Acetaminophen-Caffeine (EXCEDRIN PO)   Oral   Take 2 tablets by mouth daily as needed. For pain         . HYDROcodone-acetaminophen (HYCET) 7.5-325 mg/15 ml solution   Oral   Take 15 mLs by mouth 4 (four) times daily as needed for pain.   120 mL   0   . ondansetron (ZOFRAN) 4 MG tablet   Oral   Take 1 tablet (4 mg total) by mouth every 6 (six) hours.   12 tablet   0    BP 130/77  Pulse 71  Temp(Src) 97.1 F (36.2 C) (Oral)  Resp 18  SpO2 100%  Physical Exam  Nursing note and vitals reviewed. Constitutional: He is oriented to person, place, and time. He appears well-developed and well-nourished. No distress.  HENT:  Head: Normocephalic and atraumatic.  Eyes: Conjunctivae and EOM are normal.  Neck: Normal range of motion. Neck supple.  No meningeal signs  Cardiovascular: Normal rate, regular rhythm and normal heart sounds.  Exam reveals no gallop and no friction rub.   No murmur heard. Pulmonary/Chest: Effort normal and breath sounds normal. No respiratory distress. He has no wheezes. He has no rales. He exhibits no tenderness.  Abdominal: Soft. Bowel sounds are normal. He exhibits no distension. There is no tenderness. There is no rebound and no guarding.  Musculoskeletal: Normal range of motion. He exhibits no edema and no tenderness.  Neurological: He  is alert and oriented to person, place, and time. No cranial nerve deficit.  Skin: Skin is warm and dry. He is not diaphoretic. No erythema.  1 cm lipoma to right scapula, 1 cm abscess to right occiput, no erythema, no warmth, tender to palpation. No sign of drainage.   Psychiatric: He has a normal mood and affect.    ED Course  Procedures (including critical care time)  DIAGNOSTIC STUDIES: Oxygen Saturation is 100% on RA, normal by my interpretation.    COORDINATION OF CARE: 8:42 PM-Discussed treatment plan which includes IND with pt at bedside and pt agreed to plan.  Advised pt to follow up with dermatology for the abscess on his right flank.   INCISION AND DRAINAGE Performed by: Glade Nurse, PA-C Consent: Verbal consent obtained. Risks and benefits: risks, benefits and alternatives were discussed Type: abscess  Body area: right occiput   Anesthesia: local infiltration  Incision was made with a scalpel.  Local anesthetic: lidocaine 2% with epinephrine  Anesthetic total: 1 ml  Complexity: simple Blunt dissection to break up loculations  Drainage: purulent  Drainage amount: minimal  Packing material: none  Patient tolerance: Patient tolerated the procedure well with no immediate complications.     Labs Reviewed - No data to display No results found. 1. Abscess of head     MDM  Patient with skin abscess amenable to incision and drainage.  Abscess was not large enough to warrant packing or drain,  wound recheck in 2 days. Encouraged home warm soaks and flushing.  No signs of cellulitis. No abx warranted. Provided pt with resource list to establish relationship with primary care doctor. Discussed reasons to seek immediate care. Patient expresses understanding and agrees with plan.  I personally performed the services described in this documentation, which was scribed in my presence. The recorded information has been reviewed and is accurate.    Glade Nurse, PA-C 06/24/13 0001

## 2013-06-22 NOTE — ED Notes (Signed)
PT. REPORTS ABSCESS AT BACK SCALP AND RIGHT UPPER BACK WITH SLIGHT DRAINAGE FOR SEVERAL DAYS .

## 2013-06-24 NOTE — ED Provider Notes (Signed)
Medical screening examination/treatment/procedure(s) were performed by non-physician practitioner and as supervising physician I was immediately available for consultation/collaboration.  Deserea Bordley R. Lucah Petta, MD 06/24/13 0739 

## 2014-07-27 ENCOUNTER — Emergency Department (HOSPITAL_COMMUNITY)
Admission: EM | Admit: 2014-07-27 | Discharge: 2014-07-27 | Disposition: A | Discharge status: Home / Self Care | Payer: Self-pay | Attending: Emergency Medicine | Admitting: Emergency Medicine

## 2014-07-27 ENCOUNTER — Emergency Department (HOSPITAL_COMMUNITY): Payer: Self-pay

## 2014-07-27 ENCOUNTER — Encounter (HOSPITAL_COMMUNITY): Payer: Self-pay | Admitting: Emergency Medicine

## 2014-07-27 DIAGNOSIS — J020 Streptococcal pharyngitis: Secondary | ICD-10-CM | POA: Insufficient documentation

## 2014-07-27 DIAGNOSIS — F17200 Nicotine dependence, unspecified, uncomplicated: Secondary | ICD-10-CM | POA: Insufficient documentation

## 2014-07-27 DIAGNOSIS — Z8719 Personal history of other diseases of the digestive system: Secondary | ICD-10-CM | POA: Insufficient documentation

## 2014-07-27 DIAGNOSIS — J029 Acute pharyngitis, unspecified: Secondary | ICD-10-CM | POA: Insufficient documentation

## 2014-07-27 DIAGNOSIS — H9209 Otalgia, unspecified ear: Secondary | ICD-10-CM | POA: Insufficient documentation

## 2014-07-27 LAB — RAPID STREP SCREEN (MED CTR MEBANE ONLY): STREPTOCOCCUS, GROUP A SCREEN (DIRECT): POSITIVE — AB

## 2014-07-27 MED ORDER — ACETAMINOPHEN 325 MG PO TABS
650.0000 mg | ORAL_TABLET | Freq: Four times a day (QID) | ORAL | Status: DC | PRN
  Administered 2014-07-27: 650 mg via ORAL

## 2014-07-27 MED ORDER — PENICILLIN G BENZATHINE 1200000 UNIT/2ML IM SUSP
1.2000 10*6.[IU] | Freq: Once | INTRAMUSCULAR | Status: AC
  Administered 2014-07-27: 1.2 10*6.[IU] via INTRAMUSCULAR
  Filled 2014-07-27: qty 2

## 2014-07-27 MED ORDER — DEXAMETHASONE SODIUM PHOSPHATE 10 MG/ML IJ SOLN
10.0000 mg | Freq: Once | INTRAMUSCULAR | Status: DC
Start: 2014-07-27 — End: 2014-07-27
  Filled 2014-07-27: qty 1

## 2014-07-27 MED ORDER — ACETAMINOPHEN 325 MG PO TABS
ORAL_TABLET | ORAL | Status: AC
  Filled 2014-07-27: qty 2

## 2014-07-27 MED ORDER — KETOROLAC TROMETHAMINE 30 MG/ML IJ SOLN
30.0000 mg | Freq: Once | INTRAMUSCULAR | Status: AC
  Administered 2014-07-27: 30 mg via INTRAMUSCULAR
  Filled 2014-07-27: qty 1

## 2014-07-27 MED ORDER — DEXAMETHASONE SODIUM PHOSPHATE 10 MG/ML IJ SOLN
10.0000 mg | Freq: Once | INTRAMUSCULAR | Status: AC
Start: 2014-07-27 — End: 2014-07-27
  Administered 2014-07-27: 10 mg via INTRAMUSCULAR

## 2014-07-27 NOTE — ED Notes (Signed)
Pt presents with sore throat, pain with swallowing, Right ear pain, glands tender to touch, fever and all over body aches x2 days

## 2014-07-27 NOTE — ED Provider Notes (Signed)
CSN: [id number]     Arrival date & time 07/27/14  2119 History   First MD Initiated Contact with Patient 07/27/14 2203     Chief Complaint  Patient presents with  . Fever  . Sore Throat   Patient is a 33 year old AAM who presents today with sore throat and fevers as well as body aches. Patient began having fevers yesterday with a high 102.8 orally. Patient says it hurts to swallow. He feels like he needs to cough but has not had any coughing. He hasn't had much to eat or drink today after falling hurts greatly. Also has right ear pain.  He denies CP, SOB, N/V, diarrhea, constipation, abd pain, hematemesis, dysuria, hematuria, sick contacts, or recent travel.   (Consider location/radiation/quality/duration/timing/severity/associated sxs/prior Treatment) Patient is a 33 y.o. male presenting with fever and pharyngitis. The history is provided by the patient.  Fever Max temp prior to arrival:  102.8 Temp source:  Oral Severity:  Moderate Onset quality:  Sudden Duration:  2 days Timing:  Constant Progression:  Unchanged Associated symptoms: ear pain (right) and sore throat   Associated symptoms: no chest pain, no chills, no cough, no diarrhea, no dysuria, no headaches, no nausea and no vomiting   Sore Throat This is a new problem. The current episode started yesterday. The problem occurs constantly. The problem has been gradually worsening. Associated symptoms include a fever and a sore throat. Pertinent negatives include no abdominal pain, chest pain, chills, coughing, headaches, nausea, neck pain or vomiting.    Past Medical History  Diagnosis Date  . GERD (gastroesophageal reflux disease)    Past Surgical History  Procedure Laterality Date  . Mandible fracture surgery     No family history on file. History  Substance Use Topics  . Smoking status: Current Some Day Smoker    Types: Cigarettes  . Smokeless tobacco: Not on file  . Alcohol Use: Yes     Comment: occasional     Review of Systems  Constitutional: Positive for fever. Negative for chills.  HENT: Positive for ear pain (right) and sore throat. Negative for ear discharge, facial swelling, trouble swallowing and voice change.   Respiratory: Negative for cough and shortness of breath.   Cardiovascular: Negative for chest pain, palpitations and leg swelling.  Gastrointestinal: Negative for nausea, vomiting, abdominal pain, diarrhea, constipation and abdominal distention.  Genitourinary: Negative for dysuria, frequency, flank pain and decreased urine volume.  Musculoskeletal: Negative for neck pain.  Neurological: Negative for dizziness, speech difficulty, light-headedness and headaches.  All other systems reviewed and are negative.     Allergies  Fish allergy and Fruit & vegetable daily  Home Medications   Prior to Admission medications   Not on File   BP 134/93  Pulse 123  Temp(Src) 102.8 F (39.3 C) (Oral)  Resp 18  SpO2 97% Physical Exam  Nursing note and vitals reviewed. Constitutional: He is oriented to person, place, and time. He appears well-developed and well-nourished. No distress.  HENT:  Head: Normocephalic and atraumatic.  Mouth/Throat: Uvula is midline. Oropharyngeal exudate present.  Eyes: Pupils are equal, round, and reactive to light.  Neck: Normal range of motion. No tracheal deviation present.  Cardiovascular: Regular rhythm, normal heart sounds and intact distal pulses.  Exam reveals no gallop and no friction rub.   No murmur heard. Pulmonary/Chest: Effort normal and breath sounds normal. No stridor. No respiratory distress. He has no wheezes. He has no rales. He exhibits no tenderness.  Abdominal: Soft. Bowel sounds  are normal. He exhibits no distension and no mass. There is no tenderness. There is no rebound and no guarding.  Musculoskeletal: Normal range of motion.  Lymphadenopathy:    He has cervical adenopathy.  Neurological: He is alert and oriented to  person, place, and time.  Skin: Skin is warm and dry. He is not diaphoretic.    ED Course  Procedures (including critical care time) Labs Review Labs Reviewed  RAPID STREP SCREEN - Abnormal; Notable for the following:    Streptococcus, Group A Screen (Direct) POSITIVE (*)    All other components within normal limits    Imaging Review Dg Chest 2 View  07/27/2014   CLINICAL DATA:  Fever.  Sore throat.  Body aches.  EXAM: CHEST  2 VIEW  COMPARISON:  05/28/2009  FINDINGS: The heart size and mediastinal contours are within normal limits. Both lungs are clear. The visualized skeletal structures are unremarkable.  IMPRESSION: No active cardiopulmonary disease.   Electronically Signed   By: Sherryl Barters M.D.   On: 07/27/2014 22:47     EKG Interpretation None      MDM   33 year old AAM who presents today with sore throat, fevers. Please see history of present illness for details. On exam patient in NAD, febrile at 102.8, VSS aside from tachycardia. Chest x-ray ordered in triage shows no acute cardiopulmonary abnormalities. Strep screen positive. Patient has lymphadenopathy and tonsillar exudates. However no sign of retropharyngeal abscess.   Strep pharyngitis: given Bicillin IM and given IM Decadron. In triage, given Tylenol for fever.   Stable for DC home. Encouraged plenty of fluids and symptomatic control of fevers and pain w/tylenol and motrin. OTC lozenges as needed. Strict return precautions include PO intolerance, shortness of breath, severe chest pain, neck swelling, or F/C unresponsive to tylenol/motrin. Follow up to PCP in 1-2 weeks otherwise.   Final diagnoses:  Strep pharyngitis    Pt was seen under the supervision of Dr. Ashok Cordia.     Sherian Maroon, MD 07/28/14 [phone]

## 2014-07-27 NOTE — Discharge Instructions (Signed)

## 2014-07-27 NOTE — ED Notes (Signed)
Pt. Here for fever starting yesterday 101-102 with sore throat and generalized body aches and right ear pain. Slight nausea.

## 2014-08-04 NOTE — ED Provider Notes (Signed)
I saw and evaluated the patient, reviewed the resident's note and I agree with the findings and plan.  Pt c/o sore throat. Bilateral. Exam c/w strep pharyngitis. No asymmetric swelling or pain. No abscess noted. No stridor or difficulty breathing or swallowing.      Mirna Mires, MD 08/04/14 [phone]

## 2014-10-13 ENCOUNTER — Inpatient Hospital Stay (HOSPITAL_COMMUNITY)
Admission: EM | Admit: 2014-10-13 | Discharge: 2014-10-16 | DRG: 201 | Disposition: A | Discharge status: Home / Self Care | Payer: Self-pay | Attending: Cardiothoracic Surgery | Admitting: Cardiothoracic Surgery

## 2014-10-13 ENCOUNTER — Encounter (HOSPITAL_COMMUNITY): Payer: Self-pay | Admitting: Emergency Medicine

## 2014-10-13 ENCOUNTER — Other Ambulatory Visit: Payer: Self-pay

## 2014-10-13 ENCOUNTER — Inpatient Hospital Stay (HOSPITAL_COMMUNITY): Payer: Self-pay

## 2014-10-13 ENCOUNTER — Emergency Department (HOSPITAL_COMMUNITY): Payer: Self-pay

## 2014-10-13 DIAGNOSIS — R079 Chest pain, unspecified: Secondary | ICD-10-CM

## 2014-10-13 DIAGNOSIS — J939 Pneumothorax, unspecified: Secondary | ICD-10-CM | POA: Diagnosis present

## 2014-10-13 DIAGNOSIS — J9383 Other pneumothorax: Principal | ICD-10-CM | POA: Diagnosis present

## 2014-10-13 DIAGNOSIS — Z4682 Encounter for fitting and adjustment of non-vascular catheter: Secondary | ICD-10-CM

## 2014-10-13 DIAGNOSIS — Z9689 Presence of other specified functional implants: Secondary | ICD-10-CM

## 2014-10-13 DIAGNOSIS — K219 Gastro-esophageal reflux disease without esophagitis: Secondary | ICD-10-CM | POA: Diagnosis present

## 2014-10-13 DIAGNOSIS — F17210 Nicotine dependence, cigarettes, uncomplicated: Secondary | ICD-10-CM | POA: Diagnosis present

## 2014-10-13 DIAGNOSIS — J930 Spontaneous tension pneumothorax: Secondary | ICD-10-CM

## 2014-10-13 LAB — CBC
HEMATOCRIT: 45.8 % (ref 39.0–52.0)
Hemoglobin: 15.5 g/dL (ref 13.0–17.0)
MCH: 28.6 pg (ref 26.0–34.0)
MCHC: 33.8 g/dL (ref 30.0–36.0)
MCV: 84.5 fL (ref 78.0–100.0)
Platelets: 231 10*3/uL (ref 150–400)
RBC: 5.42 MIL/uL (ref 4.22–5.81)
RDW: 13.3 % (ref 11.5–15.5)
WBC: 10.8 10*3/uL — AB (ref 4.0–10.5)

## 2014-10-13 LAB — I-STAT TROPONIN, ED: Troponin i, poc: 0 ng/mL (ref 0.00–0.08)

## 2014-10-13 LAB — BASIC METABOLIC PANEL
Anion gap: 13 (ref 5–15)
BUN: 7 mg/dL (ref 6–23)
CHLORIDE: 98 meq/L (ref 96–112)
CO2: 28 mEq/L (ref 19–32)
CREATININE: 1 mg/dL (ref 0.50–1.35)
Calcium: 9.9 mg/dL (ref 8.4–10.5)
GFR calc non Af Amer: >90 mL/min (ref >90)
Glucose, Bld: 94 mg/dL (ref 70–99)
Potassium: 4.2 mEq/L (ref 3.7–5.3)
Sodium: 139 mEq/L (ref 137–147)

## 2014-10-13 MED ORDER — OXYCODONE HCL 5 MG PO TABS
10.0000 mg | ORAL_TABLET | ORAL | Status: DC | PRN
  Administered 2014-10-13 – 2014-10-15 (×4): 10 mg via ORAL
  Administered 2014-10-16 (×2): 5 mg via ORAL
  Filled 2014-10-13 (×5): qty 2

## 2014-10-13 MED ORDER — SODIUM CHLORIDE 0.9 % IV SOLN
INTRAVENOUS | Status: DC
  Administered 2014-10-13 – 2014-10-14 (×2): via INTRAVENOUS

## 2014-10-13 MED ORDER — MIDAZOLAM HCL 2 MG/2ML IJ SOLN
2.0000 mg | Freq: Once | INTRAMUSCULAR | Status: AC
  Administered 2014-10-13: 1 mg via INTRAVENOUS
  Filled 2014-10-13: qty 2

## 2014-10-13 MED ORDER — MORPHINE SULFATE 4 MG/ML IJ SOLN
4.0000 mg | Freq: Once | INTRAMUSCULAR | Status: AC
  Administered 2014-10-13: 4 mg via INTRAVENOUS
  Filled 2014-10-13: qty 1

## 2014-10-13 MED ORDER — LIDOCAINE HCL (PF) 1 % IJ SOLN
INTRAMUSCULAR | Status: AC
  Filled 2014-10-13: qty 5

## 2014-10-13 MED ORDER — LIDOCAINE HCL (PF) 1 % IJ SOLN
10.0000 mL | Freq: Once | INTRAMUSCULAR | Status: AC
  Administered 2014-10-13: 10 mL
  Filled 2014-10-13: qty 10

## 2014-10-13 MED ORDER — MORPHINE SULFATE 2 MG/ML IJ SOLN
2.0000 mg | Freq: Once | INTRAMUSCULAR | Status: AC
  Administered 2014-10-13: 2 mg via INTRAVENOUS
  Filled 2014-10-13: qty 1

## 2014-10-13 MED ORDER — MORPHINE SULFATE 2 MG/ML IJ SOLN
2.0000 mg | INTRAMUSCULAR | Status: DC | PRN
  Administered 2014-10-13 – 2014-10-14 (×2): 2 mg via INTRAVENOUS
  Filled 2014-10-13 (×3): qty 1

## 2014-10-13 MED ORDER — DOCUSATE SODIUM 100 MG PO CAPS
100.0000 mg | ORAL_CAPSULE | Freq: Two times a day (BID) | ORAL | Status: DC
  Administered 2014-10-13 – 2014-10-16 (×4): 100 mg via ORAL
  Filled 2014-10-13 (×7): qty 1

## 2014-10-13 MED ORDER — ONDANSETRON HCL 4 MG/2ML IJ SOLN
4.0000 mg | Freq: Four times a day (QID) | INTRAMUSCULAR | Status: DC | PRN
  Administered 2014-10-14: 4 mg via INTRAVENOUS
  Filled 2014-10-13: qty 2

## 2014-10-13 NOTE — ED Provider Notes (Signed)
CSN: [id number]     Arrival date & time 10/13/14  2000 History   First MD Initiated Contact with Patient 10/13/14 2059     Chief Complaint  Patient presents with  . Chest Pain    HPI Patient suddenly started developing chest pain that started about 2 hours ago. The pain is sharp in the center and left side of his chest. The pain increases with respiration. He is also feeling short of breath.  He is not having any trouble with leg pain or leg swelling. He denies any hemoptysis. Patient does smoke cigarettes occasionally. He denies any prior history of any significant medical problems. Past Medical History  Diagnosis Date  . GERD (gastroesophageal reflux disease)    Past Surgical History  Procedure Laterality Date  . Mandible fracture surgery     No family history on file. History  Substance Use Topics  . Smoking status: Current Some Day Smoker    Types: Cigarettes  . Smokeless tobacco: Not on file  . Alcohol Use: Yes     Comment: occasional    Review of Systems  All other systems reviewed and are negative.     Allergies  Fish allergy and Fruit & vegetable daily  Home Medications   Prior to Admission medications   Not on File   BP 136/94  Pulse 66  Temp(Src) 98.1 F (36.7 C) (Oral)  Resp 20  SpO2 100% Physical Exam  Nursing note and vitals reviewed. Constitutional: He appears well-developed and well-nourished. No distress.  HENT:  Head: Normocephalic and atraumatic.  Right Ear: External ear normal.  Left Ear: External ear normal.  Eyes: Conjunctivae are normal. Right eye exhibits no discharge. Left eye exhibits no discharge. No scleral icterus.  Neck: Neck supple. No tracheal deviation present.  Cardiovascular: Normal rate, regular rhythm and intact distal pulses.   Pulmonary/Chest: Effort normal. No stridor. No respiratory distress. He has decreased breath sounds in the left upper field, the left middle field and the left lower field. He has no wheezes. He has  no rales.  Abdominal: Soft. Bowel sounds are normal. He exhibits no distension. There is no tenderness. There is no rebound and no guarding.  Musculoskeletal: He exhibits no edema and no tenderness.  Neurological: He is alert. He has normal strength. No cranial nerve deficit (no facial droop, extraocular movements intact, no slurred speech) or sensory deficit. He exhibits normal muscle tone. He displays no seizure activity. Coordination normal.  Skin: Skin is warm and dry. No rash noted.  Psychiatric: He has a normal mood and affect.    ED Course  Procedures (including critical care time) Labs Review Labs Reviewed  CBC - Abnormal; Notable for the following:    WBC 10.8 (*)    All other components within normal limits  BASIC METABOLIC PANEL  I-STAT TROPOININ, ED    Imaging Review Dg Chest 2 View  10/13/2014   CLINICAL DATA:  Sudden chest pain and shortness of breath this afternoon  EXAM: CHEST  2 VIEW  COMPARISON:  07/27/2014  FINDINGS: Cardiac shadow is within normal limits. No significant midline shift is noted. The right lung is well aerated. The left lung shows near complete collapse secondary to a large pneumothorax. No acute bony abnormality is noted.  IMPRESSION: Large left pneumothorax.  No significant midline shift is seen.  These results were called by telephone at the time of interpretation on 10/13/2014 at 8:55 pm to Dr. Tomi Bamberger, who verbally acknowledged these results.   Electronically Signed  By: Inez Catalina M.D.   On: 10/13/2014 20:59     EKG Interpretation None      MDM   Final diagnoses:  Pneumothorax on left    Pt's CXR shows a large pneumothorax.  Pt remains medically stable.  No physical signs of tension pneumothorax.  Pt can breathe easily in the ED.  Will plan on consultation with CT surgery.  Preparations made for chest tube placement.    Dorie Rank, MD 10/13/14 2123

## 2014-10-13 NOTE — H&P (Signed)
[address]       [place] [address]             [phone]        Alcee A Mitchell-Goins [facility] Medical Record #[id number] Date of Birth: 04/28/1981  Referring: No ref. provider found Primary Care: No PCP Per Patient  Chief Complaint:    Chief Complaint  Patient presents with  . Chest Pain    History of Present Illness:     33 yo smoker with no previous hx of ptx or asthema presents with sudden onset of left chest pain and sob.   Current Activity/ Functional Status: Patient is independent with mobility/ambulation, transfers, ADL's, IADL's.   Zubrod Score: At the time of surgery this patient's most appropriate activity status/level should be described as: [x]     0    Normal activity, no symptoms []     1    Restricted in physical strenuous activity but ambulatory, able to do out light work []     2    Ambulatory and capable of self care, unable to do work activities, up and about                 more than 50%  Of the time                            []     3    Only limited self care, in bed greater than 50% of waking hours []     4    Completely disabled, no self care, confined to bed or chair []     5    Moribund  Past Medical History  Diagnosis Date  . GERD (gastroesophageal reflux disease)     Past Surgical History  Procedure Laterality Date  . Mandible fracture surgery      History  Smoking status  . Current Some Day Smoker  . Types: Cigarettes  Smokeless tobacco  . Not on file   History  Alcohol Use  . Yes    Comment: occasional    History   Social History  . Marital Status: Married    Spouse Name: N/A    Number of Children: 9 children  . Years of Education: N/A   Occupational History  . Works as Art gallery manager   Social History Main Topics  . Smoking status: Current Some Day Smoker    Types: Cigarettes  . Smokeless tobacco: Not on file  . Alcohol Use: Yes     Comment: occasional  . Drug Use: No  . Sexual Activity: Not  on file   Other Topics Concern  . Not on file   Social History Narrative  . No narrative on file    Allergies  Allergen Reactions  . Fish Allergy Itching and Nausea And Vomiting  . Fruit & Vegetable Daily [Nutritional Supplements] Itching    Current Facility-Administered Medications  Medication Dose Route Frequency Provider Last Rate Last Dose  . 0.9 %  sodium chloride infusion   Intravenous Continuous Grace Isaac, MD      . docusate sodium (COLACE) capsule 100 mg  100 mg Oral BID Grace Isaac, MD      . lidocaine (PF) (XYLOCAINE) 1 % injection 10 mL  10 mL Other Once Grace Isaac, MD      . midazolam (VERSED) injection 2 mg  2 mg Intravenous Once Grace Isaac, MD      .  morphine 2 MG/ML injection 2 mg  2 mg Intravenous Once Grace Isaac, MD      . morphine 2 MG/ML injection 2 mg  2 mg Intravenous Q3H PRN Grace Isaac, MD      . ondansetron [facility]) injection 4 mg  4 mg Intravenous Q6H PRN Grace Isaac, MD      . oxyCODONE (Oxy IR/ROXICODONE) immediate release tablet 10 mg  10 mg Oral Q4H PRN Grace Isaac, MD       No current outpatient prescriptions on file.     (Not in a hospital admission)  No family history on file.   Review of Systems:     Cardiac Review of Systems: Y or N  Chest Pain [ y   ]  Resting SOB [  y ] Exertional SOB  Blue.Reese  ]  Orthopnea [  ]   Pedal Edema [ n  ]    Palpitations [n  ] Syncope  [n  ]   Presyncope [n  ]  General Review of Systems: [Y] = yes [  ]=no Constitional: recent weight change [  ]; anorexia [  ]; fatigue [  ]; nausea [  ]; night sweats [  ]; fever [  ]; or chills [  ]                                                               Dental: poor dentition[  ]; Last Dentist visit:   Eye : blurred vision [  ]; diplopia [   ]; vision changes [  ];  Amaurosis fugax[  ]; Resp: cough [  ];  wheezing[  ];  hemoptysis[n  ]; shortness of breath[ y ]; paroxysmal nocturnal dyspnea[  ]; dyspnea on exertion[ y ]; or  orthopnea[  ];  GI:  gallstones[  ], vomiting[  ];  dysphagia[  ]; melena[  ];  hematochezia [  ]; heartburn[  ];   Hx of  Colonoscopy[  ]; GU: kidney stones [  ]; hematuria[  ];   dysuria [  ];  nocturia[  ];  history of     obstruction [  ]; urinary frequency [  ]             Skin: rash, swelling[  ];, hair loss[  ];  peripheral edema[  ];  or itching[  ]; Musculosketetal: myalgias[  ];  joint swelling[  ];  joint erythema[  ];  joint pain[  ];  back pain[  ];  Heme/Lymph: bruising[  ];  bleeding[  ];  anemia[  ];  Neuro: TIA[  ];  headaches[  ];  stroke[  ];  vertigo[  ];  seizures[  ];   paresthesias[  ];  difficulty walking[  ];  Psych:depression[  ]; anxiety[  ];  Endocrine: diabetes[  ];  thyroid dysfunction[  ];  Immunizations: Flu Florencio.Farrier  ]; Pneumococcal[n  ];  Other:  Physical Exam: BP 151/105  Pulse 83  Temp(Src) 98.1 F (36.7 C) (Oral)  Resp 21  SpO2 100%  General appearance: alert, cooperative, appears stated age and mild distress Neurologic: intact Heart: regular rate and rhythm, S1, S2 normal, no murmur, click, rub or gallop Lungs: diminished breath sounds LLL and LUL Abdomen: soft, non-tender;  bowel sounds normal; no masses,  no organomegaly Extremities: extremities normal, atraumatic, no cyanosis or edema and Homans sign is negative, no sign of DVT no bruits   Diagnostic Studies & Laboratory data:     Recent Radiology Findings:   Dg Chest 2 View  10/13/2014   CLINICAL DATA:  Sudden chest pain and shortness of breath this afternoon  EXAM: CHEST  2 VIEW  COMPARISON:  07/27/2014  FINDINGS: Cardiac shadow is within normal limits. No significant midline shift is noted. The right lung is well aerated. The left lung shows near complete collapse secondary to a large pneumothorax. No acute bony abnormality is noted.  IMPRESSION: Large left pneumothorax.  No significant midline shift is seen.  These results were called by telephone at the time of interpretation on 10/13/2014 at  8:55 pm to Dr. Tomi Bamberger, who verbally acknowledged these results.   Electronically Signed   By: Inez Catalina M.D.   On: 10/13/2014 20:59      Recent Lab Findings: Lab Results  Component Value Date   WBC 10.8* 10/13/2014   HGB 15.5 10/13/2014   HCT 45.8 10/13/2014   PLT 231 10/13/2014   GLUCOSE 94 10/13/2014   ALT 15 12/06/2008   AST 20 12/06/2008   NA 139 10/13/2014   K 4.2 10/13/2014   CL 98 10/13/2014   CREATININE 1.00 10/13/2014   BUN 7 10/13/2014   CO2 28 10/13/2014      Assessment / Plan:   Spontaneous ptx- lerge left Left chest tube placed      Grace Isaac MD      [address] [place] [address] Office [phone]   Beeper [phone]  10/13/2014 10:34 PM

## 2014-10-13 NOTE — ED Notes (Signed)
Pt. Reports chest pain starting x2 hours ago with SOB and lightheadedness and dry cough. Denies N/V.

## 2014-10-13 NOTE — ED Notes (Signed)
Pt remained alert and oriented x 4 throughout the procedure. NAD at this time. VS are stable.

## 2014-10-13 NOTE — Procedures (Signed)
      [address]       [place] [address]             [phone]      Chest Tube Insertion Procedure Note  Indications:  Clinically significant Pneumothorax  Pre-operative Diagnosis: Pneumothorax  Post-operative Diagnosis: Pneumothorax  Procedure Details  Informed consent was obtained for the procedure, including sedation.  Risks of lung perforation, hemorrhage, arrhythmia, and adverse drug reaction were discussed.   After sterile skin prep, using standard technique, a 28 French tube was placed in the left lateral 6 rib space.  Findings: Air from tube   Estimated Blood Loss:  Minimal         Specimens:  None              Complications:  None; patient tolerated the procedure well.         Disposition: to 2w         Condition: stable  Attending Attestation: I performed the procedure.

## 2014-10-13 NOTE — ED Notes (Signed)
Pt remains monitored by blood pressure, pulse ox, and 5 lead. Pts family remains at bedside.  

## 2014-10-14 ENCOUNTER — Inpatient Hospital Stay (HOSPITAL_COMMUNITY): Payer: Self-pay

## 2014-10-14 MED ORDER — KETOROLAC TROMETHAMINE 30 MG/ML IJ SOLN
30.0000 mg | Freq: Four times a day (QID) | INTRAMUSCULAR | Status: AC
  Administered 2014-10-14 – 2014-10-15 (×5): 30 mg via INTRAVENOUS
  Filled 2014-10-14 (×6): qty 1

## 2014-10-14 MED ORDER — KETOROLAC TROMETHAMINE 30 MG/ML IJ SOLN
30.0000 mg | Freq: Four times a day (QID) | INTRAMUSCULAR | Status: DC | PRN
  Filled 2014-10-14: qty 1

## 2014-10-14 NOTE — Progress Notes (Addendum)
[address]       [place] [address]             [phone]               Subjective: Having a lot of pain with inspiration.  Breathing is overall improved from prior to CT insertion.    Objective: Vital signs in last 24 hours: Patient Vitals for the past 24 hrs:  BP Temp Temp src Pulse Resp SpO2 Height Weight  10/14/14 0410 122/83 mmHg 98.1 F (36.7 C) Oral 76 18 100 % - -  10/13/14 2358 153/96 mmHg 97.4 F (36.3 C) Oral - 20 100 % 6\' 2"  (1.88 m) 141 lb 9.6 oz (64.229 kg)  10/13/14 2235 168/104 mmHg - - 73 15 100 % - -  10/13/14 2215 151/105 mmHg - - 83 21 100 % - -  10/13/14 2210 144/90 mmHg - - 75 16 100 % - -  10/13/14 2205 152/97 mmHg - - 71 15 100 % - -  10/13/14 2200 133/86 mmHg - - 70 15 100 % - -  10/13/14 2145 153/97 mmHg - - 66 16 100 % - -  10/13/14 2143 154/87 mmHg - - 78 17 100 % - -  10/13/14 2130 128/94 mmHg - - 66 21 100 % - -  10/13/14 2107 - - - - - 100 % - -  10/13/14 2009 136/94 mmHg 98.1 F (36.7 C) Oral 66 20 100 % - -   Current Weight  10/13/14 141 lb 9.6 oz (64.229 kg)     Intake/Output from previous day:      PHYSICAL EXAM:  Heart: RRR Lungs: Clear Wound: CT site dressed and dry Chest tube: No air leak    Lab Results: CBC: Recent Labs  10/13/14 2011  WBC 10.8*  HGB 15.5  HCT 45.8  PLT 231   BMET:  Recent Labs  10/13/14 2011  NA 139  K 4.2  CL 98  CO2 28  GLUCOSE 94  BUN 7  CREATININE 1.00  CALCIUM 9.9    PT/INR: No results found for this basename: LABPROT, INR,  in the last 72 hours  CXR: FINDINGS:  Chest tube on the left is unchanged in position. There is currently  a tiny apical pneumothorax on the left without appreciable tension  component.  There is some new patchy consolidation and atelectasis in the left  mid lung. Lungs elsewhere clear. Heart size and pulmonary  vascularity are normal. No adenopathy. No bone lesions.  IMPRESSION:  Chest tube unchanged in position on the  left. There is a minimal  apical pneumothorax on the left without tension component. There is  some new patchy atelectasis and consolidation in the left mid lung.  Elsewhere lungs are clear. No change in cardiac silhouette.    Assessment/Plan: S/P L CT for spontaneous ptx-  CT with minimal output and no air leak.  CXR stable with minimal apical ptx.  Hopefully can decrease CT to water seal soon.  Pain control seems to be primary issue at this point.  Will add a few doses of Toradol, continue prn Oxy, Morphine. May need to add PCA if he can't get adequate pain control.    LOS: 1 day    COLLINS,GINA H 10/14/2014 I have seen and examined Joab A Mitchell-Goins and agree with the above assessment  and plan.  Grace Isaac MD Beeper [phone] Office [phone] 10/14/2014 3:31 PM

## 2014-10-14 NOTE — Progress Notes (Addendum)
      [address]       [place] [address]             [phone]            Subjective: Patient with a lot of pain at chest tube site.  Objective: Vital signs in last 24 hours: Temp:  [97.4 F (36.3 C)-98.1 F (36.7 C)] 98.1 F (36.7 C) (10/28 0410) Pulse Rate:  [66-83] 76 (10/28 0410) Cardiac Rhythm:  [-]  Resp:  [15-21] 18 (10/28 0410) BP: (122-168)/(83-105) 122/83 mmHg (10/28 0410) SpO2:  [100 %] 100 % (10/28 0410) Weight:  [141 lb 9.6 oz (64.229 kg)] 141 lb 9.6 oz (64.229 kg) (10/27 2358)        Physical Exam:  Cardiovascular: RRR, no murmurs, gallops, or rubs. Pulmonary: Clear to auscultation bilaterally; no rales, wheezes, or rhonchi. Abdomen: Soft, non tender, bowel sounds present. Extremities: No lower extremity edema. Wounds: Clean and dry.  No erythema or signs of infection. Chest Tube: to suction and there is no air leak  Lab Results: CBC: Recent Labs  10/13/14 2011  WBC 10.8*  HGB 15.5  HCT 45.8  PLT 231   BMET:  Recent Labs  10/13/14 2011  NA 139  K 4.2  CL 98  CO2 28  GLUCOSE 94  BUN 7  CREATININE 1.00  CALCIUM 9.9    PT/INR: No results found for this basename: LABPROT, INR,  in the last 72 hours ABG:  INR: Will add last result for INR, ABG once components are confirmed Will add last 4 CBG results once components are confirmed  Assessment/Plan:  1. CV - SR in the 70's. 2.  Pulmonary - Chest tube is to suction. There is no air leak. Likely place chest tube to water seal. CXR this am shows trace left apical pnumothorax. Check CXR in am. 3. Regarding pain control, give 3 doses of Toradol today  ZIMMERMAN,DONIELLE MPA-C 10/14/2014,8:04 AM  No air leak this am, ct to water seal  I have seen and examined Dyrell A Mitchell-Goins and agree with the above assessment  and plan.  Grace Isaac MD Beeper [phone] Office [phone] 10/14/2014 3:30 PM

## 2014-10-15 ENCOUNTER — Inpatient Hospital Stay (HOSPITAL_COMMUNITY): Payer: Self-pay

## 2014-10-15 NOTE — Discharge Summary (Signed)
[address]       [place] [address]             [phone]              Discharge Summary  Name: Colton Price DOB: [birth year] 33 y.o. MRN: [id number]   Admission Date: 10/13/2014 Discharge Date: 10/16/2014    Admitting Diagnosis: Spontaneous left pneumothorax   Discharge Diagnosis:  Spontaneous left pneumothorax  Past Medical History  Diagnosis Date  . GERD (gastroesophageal reflux disease)      Procedures: Left chest tube placement - 10/13/2014   HPI:  The patient is a 33 y.o. male with a history of tobacco use who presented to the ER at [facility] on the date of admission with acute onset left sided chest pain that began about 2 hours prior to presentation and was associated with dyspnea. Chest x-ray in the ER revealed a large left pneumothorax.  TCTS was consulted for evaluation.  Dr. Servando Snare saw the patient and placed a left chest tube. The patient was subsequently admitted for chest tube management.    Hospital Course:  The patient was admitted to [facility] on 10/13/2014. Chest x-ray revealed almost complete re-expansion of the lung following chest tube placement.  The chest tube was placed to suction, and over the next 24 hours was decreased to water seal. Chest x-rays remained stable and there was no air leak in the chest tube.  The chest tube was removed without difficulty on 10/15/2014. He is ambulating on room air without problems. He is tolerating a diet. He is felt surgically stable for discharge today. He was instructed that he is to NOT do any strenuous physical activity until instructed otherwise.   Recent vital signs:  Filed Vitals:   10/16/14 0500  BP: 107/68  Pulse: 55  Temp: 97.6 F (36.4 C)  Resp: 18    Recent laboratory studies:  CBC:  Recent Labs  10/13/14 2011  WBC 10.8*  HGB 15.5  HCT 45.8  PLT 231   BMET:   Recent Labs  10/13/14 2011  NA 139  K 4.2  CL 98  CO2 28  GLUCOSE 94    BUN 7  CREATININE 1.00  CALCIUM 9.9    PT/INR: No results found for this basename: LABPROT, INR,  in the last 72 hours   Discharge Medications:     Medication List         Oxycodone HCl 10 MG Tabs  Take 1 tablet (10 mg total) by mouth every 4 (four) hours as needed for severe pain.         Discharge Instructions:  The patient is to refrain from driving, heavy lifting or strenuous activity.  May shower daily and clean incisions with soap and water.  May resume regular diet.   Follow Up:  Follow-up Information   Follow up with GERHARDT,EDWARD B, MD. (Appointment is with nurse only for suture removal at 10:00 am)    Specialty:  Cardiothoracic Surgery   Contact information:   [address] [place] [address] [phone]       Follow up with Grace Isaac, MD On 11/05/2014. (PA/LAT CXR to be taken (at [facility] which is in the same building as Dr. Everrett Coombe office) on 11/05/2014 at 11:30 am;Appointment with Dr. Servando Snare is at 12:30 pm )    Specialty:  Cardiothoracic Surgery   Contact information:   [address]  [place] [address] [phone]        Jahnya Trindade M 10/16/2014, 7:47 AM

## 2014-10-15 NOTE — Progress Notes (Signed)
Left CT pulled per order and unit protocol.  Pt tolerated well.   No complaints of pain or SOB post pull.  Portable CXR notified and on the way.  Pt advised to remain in bed until after CXR.  Call bell in reach, family at bedside, will con't plan of care.

## 2014-10-15 NOTE — Clinical Documentation Improvement (Signed)
"  Spontaneous Pneumothorax - large left" is documented in the current medical record requiring left chest tube placement.  ICD 10 requires greater specificity in order to code spontaneous pneumothorax.  Please specify if the spontaneous pneumothorax is:   - Primary   - Secondary   - Other specified   - Unable to Clinically Determine  Thank You, Erling Conte ,RN Clinical Documentation Specialist:  [phone] [facility]

## 2014-10-15 NOTE — Progress Notes (Addendum)
      [address]       [place] [address]             [phone]            Subjective: Patient with complaints of pain in his right upper back  Objective: Vital signs in last 24 hours: Temp:  [98.2 F (36.8 C)-98.4 F (36.9 C)] 98.4 F (36.9 C) (10/29 0500) Pulse Rate:  [62-103] 103 (10/29 0500) Cardiac Rhythm:  [-] Normal sinus rhythm (10/28 2000) Resp:  [18] 18 (10/29 0500) BP: (120-136)/(77-79) 136/77 mmHg (10/29 0500) SpO2:  [98 %-100 %] 100 % (10/29 0500)     10/28 0701 - 10/29 0700 In: 600 [P.O.:600] Out: 830 [Urine:800; Chest Tube:30]  Physical Exam:  Cardiovascular: RRR, no murmurs, gallops, or rubs. Pulmonary: Clear to auscultation bilaterally; no rales, wheezes, or rhonchi. Abdomen: Soft, non tender, bowel sounds present. Extremities: No lower extremity edema. Wounds: Clean and dry.  No erythema or signs of infection. Chest Tube: to suction and there is no air leak  Lab Results: CBC:  Recent Labs  10/13/14 2011  WBC 10.8*  HGB 15.5  HCT 45.8  PLT 231   BMET:   Recent Labs  10/13/14 2011  NA 139  K 4.2  CL 98  CO2 28  GLUCOSE 94  BUN 7  CREATININE 1.00  CALCIUM 9.9    PT/INR: No results found for this basename: LABPROT, INR,  in the last 72 hours ABG:  INR: Will add last result for INR, ABG once components are confirmed Will add last 4 CBG results once components are confirmed  Assessment/Plan:  1. CV - SR 2.  Pulmonary - Chest tube with 30 cc of output.Chest tube is to water seal. There is no air leak. . CXR not take yet this am. Check CXR    ZIMMERMAN,DONIELLE MPA-C 10/15/2014,7:24 AM   10/15/2014 7:24 AM  Dg Chest 2 View  10/15/2014   CLINICAL DATA:  Spontaneous pneumothorax  EXAM: CHEST  2 VIEW  COMPARISON:  10/14/2014  FINDINGS: Tiny left apical pneumothorax is 5% and slightly worse. Left chest tube is stable. Hyperaeration. Normal heart size. Clear lungs.  IMPRESSION: 5% left apical pneumothorax  is slightly worse.   Electronically Signed   By: Maryclare Bean M.D.   On: 10/15/2014 08:36   Chest xray reviewed, no air leak small apical ptx, will d/c chest tube today   I have seen and examined Colton Price and agree with the above assessment  and plan.  Patient had large  Primary first occurence left spontaneous ptx   Grace Isaac MD Beeper [phone] Office [phone] 10/15/2014 8:56 AM

## 2014-10-16 ENCOUNTER — Inpatient Hospital Stay (HOSPITAL_COMMUNITY): Payer: MEDICAID

## 2014-10-16 MED ORDER — OXYCODONE HCL 10 MG PO TABS: 10.0000 mg | ORAL_TABLET | ORAL | Status: DC | PRN

## 2014-10-16 NOTE — Discharge Instructions (Signed)
Pneumothorax °A pneumothorax, commonly called a collapsed lung, is a condition in which air leaks from a lung and builds up in the space between the lung and the chest wall (pleural space). The air in a pneumothorax is trapped outside the lung and takes up space, preventing the lung from fully expanding. This is a condition that usually occurs suddenly. The buildup of air may be small or large. A small pneumothorax may go away on its own. When a pneumothorax is larger, it will often require medical treatment and hospitalization.  °CAUSES  °A pneumothorax can sometimes happen quickly with no apparent cause. People with underlying lung problems, particularly COPD or emphysema, are at higher risk of pneumothorax. However, pneumothorax can happen quickly even in people with no prior known lung problems. Trauma, surgery, medical procedures, or injury to the chest wall can also cause a pneumothorax. °SIGNS AND SYMPTOMS  °Sometimes a pneumothorax will have no symptoms. When symptoms are present, they can include: °· Chest pain. °· Shortness of breath. °· Increased rate of breathing. °· Bluish color to your lips or skin (cyanosis). °DIAGNOSIS  °Pneumothorax is usually diagnosed by a chest X-ray or chest CT scan. Your health care provider will also take a medical history and perform a physical exam to determine why you may have a pneumothorax. °TREATMENT  °A small pneumothorax may go away on its own without treatment. Extra oxygen can sometimes help a small pneumothorax go away more quickly. For a larger pneumothorax or a pneumothorax that is causing symptoms, a procedure is usually needed to drain the air. In some cases, the health care provider may drain the air using a needle. In other cases, a chest tube may be inserted into the pleural space. A chest tube is a small tube placed between the ribs and into the pleural space. This removes the extra air and allows the lung to expand back to its normal size. A large  pneumothorax will usually require a hospital stay. If there is ongoing air leakage into the pleural space, then the chest tube may need to remain in place for several days until the air leak has healed. In some cases, surgery may be needed.  °HOME CARE INSTRUCTIONS  °· Only take over-the-counter or prescription medicines as directed by your health care provider. °· If a cough or pain makes it difficult for you to sleep at night, try sleeping in a semi-upright position in a recliner or by using 2 or 3 pillows. °· Rest and limit activity as directed by your health care provider. °· If you had a chest tube and it was removed, ask your health care provider when it is okay to remove the dressing. Until your health care provider says you can remove the dressing, do not allow it to get wet. °· Do not smoke. Smoking is a risk factor for pneumothorax. °· Do not fly in an airplane or scuba dive until your health care provider says it is okay. °· Follow up with your health care provider as directed. °SEEK IMMEDIATE MEDICAL CARE IF:  °· You have increasing chest pain or shortness of breath. °· You have a cough that is not controlled with suppressants. °· You begin coughing up blood. °· You have pain that is getting worse or is not controlled with medicines. °· You cough up thick, discolored mucus (sputum) that is yellow to green in color. °· You have redness, increasing pain, or discharge at the site where a chest tube had been in place (if   your pneumothorax was treated with a chest tube). °· The site where your chest tube was located opens up. °· You feel air coming out of the site where the chest tube was placed. °· You have a fever or persistent symptoms for more than 2-3 days. °· You have a fever and your symptoms suddenly get worse. °MAKE SURE YOU:  °· Understand these instructions. °· Will watch your condition. °· Will get help right away if you are not doing well or get worse. °Document Released: 12/04/2005 Document  Revised: 09/24/2013 Document Reviewed: 07/03/2013 °Exit[employer]® Patient Information ©2015 [employer]. This information is not intended to replace advice given to you by your health care provider. Make sure you discuss any questions you have with your health care provider. ° °

## 2014-10-16 NOTE — Progress Notes (Addendum)
      [address]       [place] [address]             [phone]            Subjective: Patient feeling better-less pain.  Objective: Vital signs in last 24 hours: Temp:  [97.6 F (36.4 C)-98.5 F (36.9 C)] 97.6 F (36.4 C) (10/30 0500) Pulse Rate:  [55-71] 55 (10/30 0500) Cardiac Rhythm:  [-] Normal sinus rhythm (10/29 1945) Resp:  [18] 18 (10/30 0500) BP: (107-127)/(68-72) 107/68 mmHg (10/30 0500) SpO2:  [99 %-100 %] 99 % (10/30 0500)     10/29 0701 - 10/30 0700 In: 1080 [P.O.:1080] Out: 5020 [Urine:5000; Chest Tube:20]  Physical Exam:  Cardiovascular: RRR, no murmurs, gallops, or rubs. Pulmonary: Clear to auscultation bilaterally; no rales, wheezes, or rhonchi. Abdomen: Soft, non tender, bowel sounds present. Extremities: No lower extremity edema. Wounds: Dressing is clean and dry.     Lab Results: CBC:  Recent Labs  10/13/14 2011  WBC 10.8*  HGB 15.5  HCT 45.8  PLT 231   BMET:   Recent Labs  10/13/14 2011  NA 139  K 4.2  CL 98  CO2 28  GLUCOSE 94  BUN 7  CREATININE 1.00  CALCIUM 9.9    PT/INR: No results found for this basename: LABPROT, INR,  in the last 72 hours ABG:  INR: Will add last result for INR, ABG once components are confirmed Will add last 4 CBG results once components are confirmed  Assessment/Plan:  1. CV - SR 2.  Pulmonary - Chest tube removed yesterday . CXR appears to show stable trace left apical pneumothorax. 3. Will discharge home after seen by Dr. Servando Snare.   ZIMMERMAN,DONIELLE MPA-C 10/16/2014,7:29 AM   Chest xray reviewed  Plan d/c today I have seen and examined Colton Price and agree with the above assessment  and plan.  Grace Isaac MD Beeper [phone] Office [phone] 10/16/2014 10:25 AM

## 2014-10-16 NOTE — Progress Notes (Addendum)
Patient discharge orders placed by MD. Reviewed d/c instructions, wound/dressing care, and medications with patient and wife at bedside. Verbalized understanding. Prescription for pain med given to patient. All questions answered. IV out, tele off CCMD notified. Patient refused wheelchair use, RN walked patient and wife out to ED entrance with belongings in hand.

## 2014-10-22 ENCOUNTER — Telehealth: Payer: Self-pay | Admitting: *Deleted

## 2014-10-28 ENCOUNTER — Encounter: Payer: Self-pay | Admitting: *Deleted

## 2014-10-28 ENCOUNTER — Telehealth: Payer: Self-pay | Admitting: *Deleted

## 2014-10-28 DIAGNOSIS — G8918 Other acute postprocedural pain: Secondary | ICD-10-CM

## 2014-10-28 MED ORDER — OXYCODONE HCL 10 MG PO TABS: 10.0000 mg | ORAL_TABLET | ORAL | Status: DC | PRN

## 2014-10-28 NOTE — Progress Notes (Signed)
Patient ID: Colton Price, male   DOB: 1981-06-27, 33 y.o.   MRN: [id number] Mr. Colton Price came to the office to pick up his new script for a pain med refill.  He wanted to talk to the nurse.  I escorted him to an examining room.  He had been having an aching in the middle of his upper back since hospital discharge. Also, in the middle of his stomach, but this has gotten better. I listened to his breath sounds.  They were clear and strong all quadrants.  There was no pain on inspiration.  I suggested he use a heating pad and anti-inflammatories for his back discomfort. I reassured him that I didn't feel that it was related to his previous pneumothorax.  I also removed his chest tube suture from his previous left chest tube site, which was very well healed.  We will see him as scheduled with a chest xray on 11/05/14.

## 2014-10-28 NOTE — Telephone Encounter (Signed)
Mr. Colton Price calls requesting a pain med refill s/p chest tube insertion/removal for a large pneumothorax.  He states he is still having pain at the site.  I  Informed him a signed script would be a our front desk for pick up and he agreed.

## 2014-11-04 ENCOUNTER — Ambulatory Visit: Payer: Self-pay | Admitting: Surgery

## 2014-11-05 ENCOUNTER — Other Ambulatory Visit: Payer: Self-pay | Admitting: Cardiothoracic Surgery

## 2014-11-05 ENCOUNTER — Ambulatory Visit
Admission: RE | Admit: 2014-11-05 | Discharge: 2014-11-05 | Disposition: A | Discharge status: Home / Self Care | Payer: No Typology Code available for payment source | Source: Ambulatory Visit | Attending: Cardiothoracic Surgery | Admitting: Cardiothoracic Surgery

## 2014-11-05 ENCOUNTER — Ambulatory Visit (INDEPENDENT_AMBULATORY_CARE_PROVIDER_SITE_OTHER): Payer: Self-pay | Admitting: Cardiothoracic Surgery

## 2014-11-05 ENCOUNTER — Encounter: Payer: Self-pay | Admitting: Cardiothoracic Surgery

## 2014-11-05 VITALS — BP 129/88 | HR 71 | Resp 20 | Ht 74.0 in | Wt 140.0 lb

## 2014-11-05 DIAGNOSIS — J939 Pneumothorax, unspecified: Secondary | ICD-10-CM

## 2014-11-05 MED ORDER — TRAMADOL HCL 50 MG PO TABS
50.0000 mg | ORAL_TABLET | Freq: Four times a day (QID) | ORAL | Status: DC | PRN
Start: 2014-11-05 — End: 2015-03-03

## 2014-11-05 NOTE — Progress Notes (Signed)
[address]       [place] [address]             [phone]      Hazem A [facility]ins [facility] Medical Record #[id number] Date of Birth: December 16, 1981  Referring: Leota Jacobsen, MD Primary Care: No PCP Per Patient  Chief Complaint:   POST OP FOLLOW UP Spontaneous left pneumothorax treated with chest tube History of Present Illness:      patient's doing well after recent admission for left spontaneous pneumothorax treated with chest tube. Patient has still some residual pleuritic left and back pain but this is improving. He's had no shortness of breath. Since discharge she is no longer smoking     Past Medical History  Diagnosis Date  . GERD (gastroesophageal reflux disease)      History  Smoking status  . Current Some Day Smoker  . Types: Cigarettes  Smokeless tobacco  . Not on file    History  Alcohol Use  . Yes    Comment: occasional     Allergies  Allergen Reactions  . Fish Allergy Itching and Nausea And Vomiting  . Fruit & Vegetable Daily [Nutritional Supplements] Itching    Current Outpatient Prescriptions  Medication Sig Dispense Refill  . Oxycodone HCl 10 MG TABS Take 1 tablet (10 mg total) by mouth every 4 (four) hours as needed. 30 tablet 0  . traMADol (ULTRAM) 50 MG tablet Take 1 tablet (50 mg total) by mouth every 6 (six) hours as needed for moderate pain. 20 tablet 0   No current facility-administered medications for this visit.       Physical Exam: BP 129/88 mmHg  Pulse 71  Resp 20  Ht 6\' 2"  (1.88 m)  Wt 140 lb (63.504 kg)  BMI 17.97 kg/m2  SpO2 96%  General appearance: alert and cooperative Neurologic: intact Heart: regular rate and rhythm, S1, S2 normal, no murmur, click, rub or gallop Lungs: clear to auscultation bilaterally Abdomen: soft, non-tender; bowel sounds normal; no masses,  no organomegaly Extremities: extremities normal, atraumatic, no cyanosis or edema and Homans sign is negative, no sign  of DVT Wound: Left chest tube sites well healed and the sutures are removed   Diagnostic Studies & Laboratory data:     Recent Radiology Findings:   Dg Chest 2 View  11/05/2014   CLINICAL DATA:  History of left-sided pneumothorax in October of 2015, recent removal of chest tube, followup  EXAM: CHEST  2 VIEW  COMPARISON:  Chest x-ray of 10/16/2014  FINDINGS: No definite pneumothorax is seen. No effusion is noted. The heart is within normal limits in size. No bony abnormality is seen.  IMPRESSION: No active cardiopulmonary disease. No residual pneumothorax is seen.   Electronically Signed   By: Ivar Drape M.D.   On: 11/05/2014 14:01      Recent Lab Findings: Lab Results  Component Value Date   WBC 10.8* 10/13/2014   HGB 15.5 10/13/2014   HCT 45.8 10/13/2014   PLT 231 10/13/2014   GLUCOSE 94 10/13/2014   ALT 15 12/06/2008   AST 20 12/06/2008   NA 139 10/13/2014   K 4.2 10/13/2014   CL 98 10/13/2014   CREATININE 1.00 10/13/2014   BUN 7 10/13/2014   CO2 28 10/13/2014      Assessment / Plan:      Stable after recent placement of left chest 2 for spontaneous pneumothorax first occurrence on the left. Patient's chest x-ray  appears stable He is no longer smoking I reviewed with him the chance of recurrence. Plan to see him back as needed should he have any recurrent symptoms. He was given prescription for 20 tablets of 50 mg each Ultram for persistent chest wall chest tube site discomfort    Grace Isaac MD      [place] [place] [address] Office [phone]   Beeper [phone]  11/05/2014 2:24 PM

## 2014-12-23 ENCOUNTER — Encounter (HOSPITAL_COMMUNITY): Payer: Self-pay | Admitting: *Deleted

## 2014-12-23 ENCOUNTER — Emergency Department (HOSPITAL_COMMUNITY)
Admission: EM | Admit: 2014-12-23 | Discharge: 2014-12-23 | Disposition: A | Discharge status: Home / Self Care | Payer: No Typology Code available for payment source | Attending: Emergency Medicine | Admitting: Emergency Medicine

## 2014-12-23 DIAGNOSIS — Z720 Tobacco use: Secondary | ICD-10-CM | POA: Insufficient documentation

## 2014-12-23 DIAGNOSIS — J069 Acute upper respiratory infection, unspecified: Secondary | ICD-10-CM | POA: Insufficient documentation

## 2014-12-23 DIAGNOSIS — Z8719 Personal history of other diseases of the digestive system: Secondary | ICD-10-CM | POA: Insufficient documentation

## 2014-12-23 LAB — RAPID STREP SCREEN (MED CTR MEBANE ONLY): Streptococcus, Group A Screen (Direct): NEGATIVE

## 2014-12-23 NOTE — ED Notes (Signed)
Pt in c/o congestion, sore throat, and mild cough, no distress noted

## 2014-12-23 NOTE — ED Provider Notes (Signed)
CSN: [id number]     Arrival date & time 12/23/14  1629 History  This chart was scribed for non-physician practitioner working with Debby Freiberg, MD by Mercy Moore, ED Scribe. This patient was seen in room TR10C/TR10C and the patient's care was started at 6:03 PM.   Chief Complaint  Patient presents with  . Sore Throat   HPI Comments: Colton Price is a 34 y.o. male who presents to the Emergency Department complaining of sore throat, onset yesterday. Patient reports lightheadedness, congestion and cough. Patient denies decreased appetite, but reports that he hasn't been eating much because of his sore throat. No syncope. Patient shares recurrent episodes of Strep within the last year. Patient reports sick contacts at home: his child, viral illness.    The history is provided by the patient. No language interpreter was used.    Past Medical History  Diagnosis Date  . GERD (gastroesophageal reflux disease)    Past Surgical History  Procedure Laterality Date  . Mandible fracture surgery     History reviewed. No pertinent family history. History  Substance Use Topics  . Smoking status: Current Some Day Smoker    Types: Cigarettes  . Smokeless tobacco: Not on file  . Alcohol Use: Yes     Comment: occasional    Review of Systems  Constitutional: Negative for fever and chills.  HENT: Positive for congestion and sore throat.   Respiratory: Positive for cough.   Neurological: Positive for light-headedness. Negative for syncope.   Allergies  Fish allergy and Fruit & vegetable daily  Home Medications   Prior to Admission medications   Medication Sig Start Date End Date Taking? Authorizing Provider  Oxycodone HCl 10 MG TABS Take 1 tablet (10 mg total) by mouth every 4 (four) hours as needed. 10/28/14   Gaye Pollack, MD  traMADol (ULTRAM) 50 MG tablet Take 1 tablet (50 mg total) by mouth every 6 (six) hours as needed for moderate pain. 11/05/14   Grace Isaac, MD    Triage Vitals: BP 130/83 mmHg  Pulse 95  Temp(Src) 98.3 F (36.8 C) (Oral)  Resp 20  Ht 6\' 1"  (1.854 m)  Wt 180 lb (81.647 kg)  BMI 23.75 kg/m2  SpO2 98% Physical Exam  Constitutional: He is oriented to person, place, and time. He appears well-developed and well-nourished. No distress.  HENT:  Head: Normocephalic and atraumatic.  Mouth/Throat: Uvula is midline. Posterior oropharyngeal erythema present. No oropharyngeal exudate or posterior oropharyngeal edema.  Eyes: EOM are normal.  Neck: Neck supple. No tracheal deviation present.  Cardiovascular: Normal rate and regular rhythm.   Pulmonary/Chest: Effort normal. No respiratory distress. He has no wheezes. He has no rales.  Musculoskeletal: Normal range of motion.  Lymphadenopathy:       Head (right side): Tonsillar adenopathy present. No submental and no submandibular adenopathy present.       Head (left side): No submental, no submandibular and no tonsillar adenopathy present.  Mild right tonsillar lymphadenopathy.   Neurological: He is alert and oriented to person, place, and time.  Skin: Skin is warm and dry. He is not diaphoretic.  Psychiatric: He has a normal mood and affect. His behavior is normal.  Nursing note and vitals reviewed.   ED Course  Procedures (including critical care time)  COORDINATION OF CARE: 6:07 PM- Patient advised to stay hydrated and treat symptoms with OTC medications, including Mucinex D. Discussed treatment plan with patient at bedside and patient agreed to plan.   Labs  Review Labs Reviewed  RAPID STREP SCREEN  CULTURE, GROUP A STREP    Imaging Review No results found.   EKG Interpretation None      MDM   Final diagnoses:  URI (upper respiratory infection)   Negative strep. Patients symptoms are consistent with URI, likely viral etiology. Discussed that antibiotics are not indicated for viral infections. Pt will be discharged with symptomatic treatment.  Verbalizes  understanding and is agreeable with plan. Pt is hemodynamically stable & in NAD prior to dc.  I personally performed the services described in this documentation, which was scribed in my presence. The recorded information has been reviewed and is accurate.    Harvie Heck, PA-C 12/23/14 [id number]  Debby Freiberg, MD 12/25/14 [phone]

## 2014-12-23 NOTE — Discharge Instructions (Signed)
Call for a follow up appointment with a Family or Primary Care Provider.  Return if Symptoms worsen.   Take medication as prescribed.  Salt water gargles 3-4 times day. Drink plenty of fluids. Mucinex DM for nasal congestion.   Emergency Department Resource Guide 1) Find a Doctor and Pay Out of Pocket Although you won't have to find out who is covered by your insurance plan, it is a good idea to ask around and get recommendations. You will then need to call the office and see if the doctor you have chosen will accept you as a new patient and what types of options they offer for patients who are self-pay. Some doctors offer discounts or will set up payment plans for their patients who do not have insurance, but you will need to ask so you aren't surprised when you get to your appointment.  2) Contact Your Local Health Department Not all health departments have doctors that can see patients for sick visits, but many do, so it is worth a call to see if yours does. If you don't know where your local health department is, you can check in your phone book. The CDC also has a tool to help you locate your state's health department, and many state websites also have listings of all of their local health departments.  3) Find a [facility] If your illness is not likely to be very severe or complicated, you may want to try a walk in clinic. These are popping up all over the country in pharmacies, drugstores, and shopping centers. They're usually staffed by nurse practitioners or physician assistants that have been trained to treat common illnesses and complaints. They're usually fairly quick and inexpensive. However, if you have serious medical issues or chronic medical problems, these are probably not your best option.  No Primary Care Doctor: - Call Health Connect at  [phone] - they can help you locate a primary care doctor that  accepts your insurance, provides certain services, etc. - Physician  Referral Service- [phone]  Chronic Pain Problems: Organization         Address  Phone   Notes  [facility]  [phone] Patients need to be referred by their primary care doctor.   Medication Assistance: Organization         Address  Phone   Notes  [facility] Medication [facility] [address] [place], [place] [address] [phone] --Must be a resident of [facility] -- Must have NO insurance coverage whatsoever (no Medicaid/ Medicare, etc.) -- The pt. MUST have a primary care doctor that directs their care regularly and follows them in the community   MedAssist  [phone]   [employer]  [phone]    Agencies that provide inexpensive medical care: Organization         Address  Phone   Notes  [place]  [phone]   [facility]    [phone]   [facility] [place], [place] [address] [phone]   [place] [address], [place] [phone]   Planned Parenthood    [phone]   [facility]    [phone]   [place] and [place] [address], [place] Phone:  [phone], Fax:  [phone] Hours of Operation:  9 am - 6 pm, M-F.  Also accepts Medicaid/Medicare and  self-pay.  [facility] [place] [address], [address], [place] Phone: [phone], Fax: [phone]. Hours of Operation:  8:30 am - 5:30 pm, M-F.  Also accepts Medicaid and self-pay.  [facility] [address] [place] Phone: [phone]   [place], [place], [place] [phone], Ext. [phone] Mondays & Thursdays: 7-9 AM.  First 15 patients are seen on a first come, first serve basis.    [place] Providers:  Organization         Address  Phone   Notes  [facility] [address], [place] [phone] Also accepts self-pay patients.  [facility] [address], [place]  [phone]   [place], [address], [place] [phone]   [facility] [address] [place] [phone]   Lucianne Lei [address], [place]   [phone] Only accepts [place] Access [place] patients after they have their name applied to their card.   Self-Pay (no insurance) in [employer]:  Organization         Address  Phone   Notes  Sickle Cell Patients, [facility] [place] [phone]   [facility]gent Care [place] [phone]   [facility] [place]  [place], [place],  [phone]   [facility]/Dr. Osei-Bonsu  [address] [place] or [address], [place] [phone] Phone number for both [place] and [place] locations is the same.  Urgent Medical and [facility] [address] [place] [phone]   [facility] [address], [place] or [address] [phone]   [facility] [address] [place] [phone], phone; [phone], fax Sees patients 1st and 3rd Saturday of every month.  Must not qualify for public or private insurance (i.e. Medicaid, Medicare, [place] Health Choice, Veterans' Benefits)  Household income should be no more than 200% of the poverty level The clinic cannot treat you if you are pregnant or think you are pregnant  Sexually transmitted diseases are not treated at the clinic.    Dental Care: Organization         Address  Phone  Notes  [facility] Department of [facility] [phone] Accepts children up to age 40 who are enrolled  in [place] or [place]; pregnant women with a Medicaid card; and children who have applied for Medicaid or [place] Health Choice, but were declined, whose parents can pay a reduced fee at time of service.  [facility] Department of [facility]  [address], [place] [phone] Accepts children up to age 37 who are enrolled in [place] or [place]; pregnant women with a Medicaid card; and children who have applied for Medicaid or [place] Health Choice, but were declined, whose parents can pay a reduced fee at time of service.  [place] Adult Dental Access PROGRAM  [place] [phone] Patients are seen by appointment only. Walk-ins are not accepted. [place] will see patients 57 years of age and older. Monday - Tuesday (8am-5pm) Most Wednesdays (8:30-5pm) $  30 per visit, cash only  [facility] Adult [employer] PROGRAM  [address], [facility] [phone] Patients are seen by appointment only. Walk-ins are not accepted. [place] will see patients 62 years of age and older. One Wednesday Evening (Monthly: Volunteer Based).  $30 per visit, cash only  [place]  [phone] for adults; Children under age 14, call Graduate Pediatric Dentistry at [phone]. Children aged 37-14, please call [phone] to request a pediatric application.  Dental services are provided in all areas of dental care including fillings, crowns and bridges, complete and partial dentures, implants, gum treatment, root canals, and extractions. Preventive care is also provided. Treatment is provided to both adults and children. Patients are selected via a lottery and there is often a waiting list.   [facility] [address] [place]  [phone] [email or web address]   [facility] [address] [place], [place] [phone], Ext. [phone] Second and Fourth Thursday of each month, opens at  6:30 AM; Clinic ends at 9 AM.  Patients are seen on a first-come first-served basis, and a limited number are seen during each clinic.   [facility]  [address] Hillard Danker [place], [place] [phone]   Eligibility Requirements You must have lived in [place], [place], or [place] counties for at least the last three months.   You cannot be eligible for state or federal sponsored [employer], including [employer], [place], or [employer].   You generally cannot be eligible for healthcare insurance through your employer.    How to apply: Eligibility screenings are held every Tuesday and Wednesday afternoon from 1:00 pm until 4:00 pm. You do not need an appointment for the interview!  [facility] [address] [place], [place]   [place]  [place] Department  [place]  [phone]    Behavioral Health Resources in the Community: Intensive Outpatient Programs Organization         Address  Phone  Notes  [facility] [place]. [address] [place], [place] [phone]   [facility] Outpatient [address], [place], [place]   ADS: Alcohol & Drug Svcs [address] [place], [employer]   [place] [address]  [place], [place] or [phone]   Substance Abuse Resources Organization         Address  Phone  Notes  Alcohol and Drug Services  [phone]   [place]  [phone]   [place]   Chinita Pester  [phone]   Residential & Outpatient Substance Abuse Program  [phone]   Psychological Services Organization         Address  Phone  Notes  [facility]  [place]  [phone]   [place] [address] [place] or [phone]    Mobile Crisis Teams Organization         Address  Phone  Notes  [facility], Mobile Crisis Care Unit  [phone]   [facility]ive Psychotherapeutic Services  [address] [place], [place]   [place] [address] [place] [phone]    Self-Help/Support Groups Organization         Address  Phone  Notes  [facility]ental [facility]oc[facility] of [place] - variety of support groups  [place] Call for more information  Narcotics Anonymous (NA), [facility] [address], [employer] [place]  2 meetings at this location   Special educational needs teacher         Address  Phone  Notes  [facility]idential [facility] [place],    [place]  [phone]   [facility]  [address], [place] [address], [place], [place]   [place], [place] [phone] Admissions: 8am-3pm M-F  Incentives Substance [place] [address],    [place], [place] [phone]   [facility] [address] [place], [place], [place]   [facility] [address]  [place], [place]   [facility] Programs - Intensive Outpatient [address], [place], [place]   [facility] [place].,  [place], [place] [phone] or [phone]   [facility]idential Treatment Services (RTS) [address], [place], [place] Accepts Medicaid  [facility] [place] [address]  [place] [phone] Substance Abuse/Addiction Treatment   [facility] Organization         Address  Phone  Notes  [facility]  [phone]   Domenic Schwab, PhD [address] Arlis [place], [place]   [phone] or [phone]   [place], [place] [phone]   [facility]  [address], [place], [place] [phone] Insurance/Medicaid/sponsorship through [facility] and Families [address], [address]                                    [place], [place] [phone] [place] [address], [place] [phone]    Dr. Adele Schilder  [phone]   [facility] Dept. 1) [address], [place] 2) [place] 3)  [address], [place] [phone]  [phone]   [place] [phone] or [phone] (After Hours)

## 2014-12-27 LAB — CULTURE, GROUP A STREP

## 2014-12-28 ENCOUNTER — Telehealth (HOSPITAL_BASED_OUTPATIENT_CLINIC_OR_DEPARTMENT_OTHER): Payer: Self-pay | Admitting: *Deleted

## 2014-12-28 NOTE — Progress Notes (Signed)
ED Antimicrobial Stewardship Positive Culture Follow Up   Colton Price is an 34 y.o. male who presented to [facility] on 12/23/2014 with a chief complaint of sore throat  Chief Complaint  Patient presents with  . Sore Throat    Recent Results (from the past 720 hour(s))  Rapid strep screen     Status: None   Collection Time: 12/23/14  4:49 PM  Result Value Ref Range Status   Streptococcus, Group A Screen (Direct) NEGATIVE NEGATIVE Final    Comment: (NOTE) A Rapid Antigen test may result negative if the antigen level in the sample is below the detection level of this test. The FDA has not cleared this test as a stand-alone test therefore the rapid antigen negative result has reflexed to a Group A Strep culture.   Culture, Group A Strep     Status: None   Collection Time: 12/23/14  4:49 PM  Result Value Ref Range Status   Specimen Description THROAT  Final   Special Requests NONE  Final   Culture   Final    GROUP A STREP (S.PYOGENES) ISOLATED Performed at [facility]    Report Status 12/27/2014 FINAL  Final    [x]  Patient discharged originally without antimicrobial agent and treatment is now indicated  [name] who presented on 1/6 with sore throat, rapid strep was negative however the patient grew out Group A Strep. No treatment was given in the [facility].  New antibiotic prescription: Penicillin VK 500 mg bid x 10 days  ED Provider: Patty Sermons Camprubi Soms, PA-C  Lawson Radar 12/28/2014, 9:09 AM Infectious Diseases Pharmacist Phone# [phone]

## 2014-12-29 ENCOUNTER — Telehealth (HOSPITAL_BASED_OUTPATIENT_CLINIC_OR_DEPARTMENT_OTHER): Payer: Self-pay | Admitting: Emergency Medicine

## 2014-12-29 ENCOUNTER — Telehealth (HOSPITAL_BASED_OUTPATIENT_CLINIC_OR_DEPARTMENT_OTHER): Payer: Self-pay | Admitting: *Deleted

## 2015-01-30 ENCOUNTER — Telehealth (HOSPITAL_BASED_OUTPATIENT_CLINIC_OR_DEPARTMENT_OTHER): Payer: Self-pay | Admitting: Emergency Medicine

## 2015-01-30 NOTE — Telephone Encounter (Signed)
Unable to reach by telephone or mail, no further followup done, lost to followup

## 2015-02-19 ENCOUNTER — Emergency Department (HOSPITAL_COMMUNITY)
Admission: EM | Admit: 2015-02-19 | Discharge: 2015-02-19 | Disposition: A | Discharge status: Home / Self Care | Payer: Self-pay | Attending: Emergency Medicine | Admitting: Emergency Medicine

## 2015-02-19 ENCOUNTER — Emergency Department (HOSPITAL_COMMUNITY): Payer: Self-pay

## 2015-02-19 ENCOUNTER — Encounter (HOSPITAL_COMMUNITY): Payer: Self-pay | Admitting: Emergency Medicine

## 2015-02-19 DIAGNOSIS — Z8719 Personal history of other diseases of the digestive system: Secondary | ICD-10-CM | POA: Insufficient documentation

## 2015-02-19 DIAGNOSIS — M25511 Pain in right shoulder: Secondary | ICD-10-CM | POA: Insufficient documentation

## 2015-02-19 DIAGNOSIS — Z720 Tobacco use: Secondary | ICD-10-CM | POA: Insufficient documentation

## 2015-02-19 DIAGNOSIS — M545 Low back pain: Secondary | ICD-10-CM | POA: Insufficient documentation

## 2015-02-19 MED ORDER — DIAZEPAM 2 MG PO TABS: 2.0000 mg | ORAL_TABLET | Freq: Two times a day (BID) | ORAL | Status: DC | PRN

## 2015-02-19 MED ORDER — IBUPROFEN 800 MG PO TABS: 800.0000 mg | ORAL_TABLET | Freq: Three times a day (TID) | ORAL | Status: DC | PRN

## 2015-02-19 MED ORDER — DIAZEPAM 2 MG PO TABS
2.0000 mg | ORAL_TABLET | Freq: Once | ORAL | Status: AC
  Administered 2015-02-19: 2 mg via ORAL
  Filled 2015-02-19: qty 1

## 2015-02-19 MED ORDER — IBUPROFEN 800 MG PO TABS
800.0000 mg | ORAL_TABLET | Freq: Once | ORAL | Status: AC
  Administered 2015-02-19: 800 mg via ORAL
  Filled 2015-02-19: qty 1

## 2015-02-19 NOTE — ED Provider Notes (Signed)
CSN: [id number]     Arrival date & time 02/19/15  0018 History  This chart was scribed for Everlene Balls, MD by Molli Posey, ED Scribe. This patient was seen in room D33C/D33C and the patient's care was started 12:24 AM.  Chief Complaint  Patient presents with  . Shoulder Injury   The history is provided by the patient. No language interpreter was used.   HPI Comments: Jamile A Mitchell-Goins is a 34 y.o. male who presents to the Emergency Department complaining of right shoulder pain for the last 2 days. Pt states that he lifts heavy box springs at work which has worsened his pain. He lifts up to 100lbs at a time. He also complains of lower back pain. Patient admits to bending over at his back and lifting box springs with people shorter than him which puts him at an awkward angle.  He states that certain movements worsens his shoulder and back pain. Pt reports that he had a spontaneous pneumothorax 2 months ago and is concern the pain may be related to his chest tube. He denies any injury or falls. Pt reports no history of CA. Pt reports no alleviating factors at this time. He denies any weakness, numbness, urinary or bowel incontinence.   Past Medical History  Diagnosis Date  . GERD (gastroesophageal reflux disease)    Past Surgical History  Procedure Laterality Date  . Mandible fracture surgery     No family history on file. History  Substance Use Topics  . Smoking status: Current Some Day Smoker    Types: Cigarettes  . Smokeless tobacco: Not on file  . Alcohol Use: Yes     Comment: occasional    Review of Systems  Musculoskeletal: Positive for myalgias, back pain and arthralgias.  All other systems reviewed and are negative.  Allergies  Fish allergy and Fruit & vegetable daily  Home Medications   Prior to Admission medications   Medication Sig Start Date End Date Taking? Authorizing Provider  Oxycodone HCl 10 MG TABS Take 1 tablet (10 mg total) by mouth every 4 (four)  hours as needed. 10/28/14   Gaye Pollack, MD  traMADol (ULTRAM) 50 MG tablet Take 1 tablet (50 mg total) by mouth every 6 (six) hours as needed for moderate pain. 11/05/14   Grace Isaac, MD   BP 132/84 mmHg  Pulse 80  Temp(Src) 98.1 F (36.7 C) (Oral)  Resp 18  SpO2 99% Physical Exam  Constitutional: He is oriented to person, place, and time. Vital signs are normal. He appears well-developed and well-nourished.  Non-toxic appearance. He does not appear ill. No distress.  HENT:  Head: Normocephalic and atraumatic.  Nose: Nose normal.  Mouth/Throat: Oropharynx is clear and moist. No oropharyngeal exudate.  Eyes: Conjunctivae and EOM are normal. Pupils are equal, round, and reactive to light. No scleral icterus.  Neck: Normal range of motion. Neck supple. No tracheal deviation, no edema, no erythema and normal range of motion present. No thyroid mass and no thyromegaly present.  Cardiovascular: Normal rate, regular rhythm, S1 normal, S2 normal, normal heart sounds, intact distal pulses and normal pulses.  Exam reveals no gallop and no friction rub.   No murmur heard. Pulses:      Radial pulses are 2+ on the right side, and 2+ on the left side.       Dorsalis pedis pulses are 2+ on the right side, and 2+ on the left side.  Pulmonary/Chest: Effort normal and breath sounds normal.  No respiratory distress. He has no wheezes. He has no rhonchi. He has no rales.  Abdominal: Soft. Normal appearance and bowel sounds are normal. He exhibits no distension, no ascites and no mass. There is no hepatosplenomegaly. There is no tenderness. There is no rebound, no guarding and no CVA tenderness.  Musculoskeletal: Normal range of motion. He exhibits no edema or tenderness.  Right anterior TPP, full ROM.   Lymphadenopathy:    He has no cervical adenopathy.  Neurological: He is alert and oriented to person, place, and time. He has normal strength. No cranial nerve deficit or sensory deficit.  Skin:  Skin is warm, dry and intact. No petechiae and no rash noted. He is not diaphoretic. No erythema. No pallor.  Psychiatric: He has a normal mood and affect. His behavior is normal. Judgment normal.  Nursing note and vitals reviewed.   ED Course  Procedures   DIAGNOSTIC STUDIES: Oxygen Saturation is 99% on RA, normal by my interpretation.    COORDINATION OF CARE: 12:30 AM Discussed treatment plan with pt at bedside and pt agreed to plan.   Labs Review Labs Reviewed - No data to display  Imaging Review Dg Shoulder Right  02/19/2015   CLINICAL DATA:  Right shoulder and low back pain after lifting heavy box at work. Initial encounter.  EXAM: RIGHT SHOULDER - 2 VIEW  COMPARISON:  None.  FINDINGS: There is no evidence of fracture or dislocation. There is no evidence of arthropathy or other focal bone abnormality. Soft tissues are unremarkable.  IMPRESSION: Negative.   Electronically Signed   By: Monte Fantasia M.D.   On: 02/19/2015 01:12     EKG Interpretation None      MDM   Final diagnoses:  None   patient since emergency department for right shoulder and lower back pain. This is in the setting of heavy lifting for his job. X-ray of his right shoulder does not show any injury. His back pain is all paraspinal muscles. There is no indication for x-rays there is no direct injury, and no red flag symptoms. Patient was given Motrin and Valium for pain relief. He'll be advised to continue Motrin as needed. His vital signs remain within his normal limits he is safe for discharge with primary care follow-up within 3 days.   I personally performed the services described in this documentation, which was scribed in my presence. The recorded information has been reviewed and is accurate.      Everlene Balls, MD 02/19/15 0120

## 2015-02-19 NOTE — ED Notes (Signed)
Pt presents with right shoulder and lower back pain after lifting a heavy box at work last night.  Denies bowel or bladder incontinence, denies numbness or tingling in extremities- ambulatory in triage.

## 2015-02-19 NOTE — Discharge Instructions (Signed)
Arthralgia Colton Price, your xray did not show any injury to the bones of your shoulder.  You have likely strained the muscles in your back and shoulder from the heavy lifting.  Be sure to bend at the knees when performing heavy lifting.  Take motrin at home as needed for pain and follow up with a primary care physician for continued management.  If symptoms worsen, come back to the ED immediately. Thank you. Arthralgia is joint pain. A joint is a place where two bones meet. Joint pain can happen for many reasons. The joint can be bruised, stiff, infected, or weak from aging. Pain usually goes away after resting and taking medicine for soreness.  HOME CARE  Rest the joint as told by your doctor.  Keep the sore joint raised (elevated) for the first 24 hours.  Put ice on the joint area.  Put ice in a plastic bag.  Place a towel between your skin and the bag.  Leave the ice on for 15-20 minutes, 03-04 times a day.  Wear your splint, casting, elastic bandage, or sling as told by your doctor.  Only take medicine as told by your doctor. Do not take aspirin.  Use crutches as told by your doctor. Do not put weight on the joint until told to by your doctor. GET HELP RIGHT AWAY IF:   You have bruising, puffiness (swelling), or more pain.  Your fingers or toes turn blue or start to lose feeling (numb).  Your medicine does not lessen the pain.  Your pain becomes severe.  You have a temperature by mouth above 102 F (38.9 C), not controlled by medicine.  You cannot move or use the joint. MAKE SURE YOU:   Understand these instructions.  Will watch your condition.  Will get help right away if you are not doing well or get worse. Document Released: 11/22/2009 Document Revised: 02/26/2012 Document Reviewed: 11/22/2009 [facility] Patient Information 2015 [place], [place]. This information is not intended to replace advice given to you by your health care provider. Make sure you discuss  any questions you have with your health care provider. Back Injury Prevention The following tips can help you to prevent a back injury. PHYSICAL FITNESS  Exercise often. Try to develop strong stomach (abdominal) muscles.  Do aerobic exercises often. This includes walking, jogging, biking, swimming.  Do exercises that help with balance and strength often. This includes tai chi and yoga.  Stretch before and after you exercise.  Keep a healthy weight. DIET   Ask your doctor how much calcium and vitamin D you need every day.  Include calcium in your diet. Foods high in calcium include dairy products; green, leafy vegetables; and products with calcium added (fortified).  Include vitamin D in your diet. Foods high in vitamin D include milk and products with vitamin D added.  Think about taking a multivitamin or other nutritional products called " supplements."  Stop smoking if you smoke. POSTURE   Sit and stand up straight. Avoid leaning forward or hunching over.  Choose chairs that support your lower back.  If you work at a desk:  Sit close to your work so you do not lean over.  Keep your chin tucked in.  Keep your neck drawn back.  Keep your elbows bent at a right angle. Your arms should look like the letter "L."  Sit high and close to the steering wheel when you drive. Add low back support to your car seat if needed.  Avoid  sitting or standing in one position for too long. Get up and move around every hour. Take breaks if you are driving for a long time.  Sleep on your side with your knees slightly bent. You can also sleep on your back with a pillow under your knees. Do not sleep on your stomach. LIFTING, TWISTING, AND REACHING  Avoid heavy lifting, especially lifting over and over again. If you must do heavy lifting:  Stretch before lifting.  Work slowly.  Rest between lifts.  Use carts and dollies to move objects when possible.  Make several small trips  instead of carrying 1 heavy load.  Ask for help when you need it.  Ask for help when moving big, awkward objects.  Follow these steps when lifting:  Stand with your feet shoulder-width apart.  Get as close to the object as you can. Do not pick up heavy objects that are far from your body.  Use handles or lifting straps when possible.  Bend at your knees. Squat down, but keep your heels off the floor.  Keep your shoulders back, your chin tucked in, and your back straight.  Lift the object slowly. Tighten the muscles in your legs, stomach, and butt. Keep the object as close to the center of your body as possible.  Reverse these directions when you put a load down.  Do not:  Lift the object above your waist.  Twist at the waist while lifting or carrying a load. Move your feet if you need to turn, not your waist.  Bend over without bending at your knees.  Avoid reaching over your head, across a table, or for an object on a high surface. OTHER TIPS  Avoid wet floors and keep sidewalks clear of ice.  Do not sleep on a mattress that is too soft or too hard.  Keep items that you use often within easy reach.  Put heavier objects on shelves at waist level. Put lighter objects on lower or higher shelves.  Find ways to lessen your stress. You can try exercise, massage, or relaxation.  Get help for depression or anxiety if needed. GET HELP IF:  You injure your back.  You have questions about diet, exercise, or other ways to prevent back injuries. MAKE SURE YOU:  Understand these instructions.  Will watch your condition.  Will get help right away if you are not doing well or get worse. Document Released: 05/22/2008 Document Revised: 02/26/2012 Document Reviewed: 01/15/2012 [facility] Patient Information 2015 [place], [place]. This information is not intended to replace advice given to you by your health care provider. Make sure you discuss any questions you have with your  health care provider.

## 2015-03-02 ENCOUNTER — Emergency Department (HOSPITAL_COMMUNITY): Payer: Self-pay

## 2015-03-02 ENCOUNTER — Encounter (HOSPITAL_COMMUNITY): Payer: Self-pay | Admitting: Adult Health

## 2015-03-02 ENCOUNTER — Emergency Department (HOSPITAL_COMMUNITY)
Admission: EM | Admit: 2015-03-02 | Discharge: 2015-03-03 | Disposition: A | Discharge status: Home / Self Care | Payer: Self-pay | Attending: Emergency Medicine | Admitting: Emergency Medicine

## 2015-03-02 ENCOUNTER — Other Ambulatory Visit: Payer: Self-pay

## 2015-03-02 DIAGNOSIS — Z8719 Personal history of other diseases of the digestive system: Secondary | ICD-10-CM | POA: Insufficient documentation

## 2015-03-02 DIAGNOSIS — M25511 Pain in right shoulder: Secondary | ICD-10-CM | POA: Insufficient documentation

## 2015-03-02 DIAGNOSIS — Z720 Tobacco use: Secondary | ICD-10-CM | POA: Insufficient documentation

## 2015-03-02 DIAGNOSIS — Z8709 Personal history of other diseases of the respiratory system: Secondary | ICD-10-CM | POA: Insufficient documentation

## 2015-03-02 DIAGNOSIS — R0789 Other chest pain: Secondary | ICD-10-CM | POA: Insufficient documentation

## 2015-03-02 HISTORY — DX: Other pneumothorax: J93.83

## 2015-03-02 LAB — CBC
HCT: 44.9 % (ref 39.0–52.0)
Hemoglobin: 14.7 g/dL (ref 13.0–17.0)
MCH: 28 pg (ref 26.0–34.0)
MCHC: 32.7 g/dL (ref 30.0–36.0)
MCV: 85.5 fL (ref 78.0–100.0)
PLATELETS: 220 10*3/uL (ref 150–400)
RBC: 5.25 MIL/uL (ref 4.22–5.81)
RDW: 13.8 % (ref 11.5–15.5)
WBC: 7.6 10*3/uL (ref 4.0–10.5)

## 2015-03-02 LAB — I-STAT TROPONIN, ED: Troponin i, poc: 0 ng/mL (ref 0.00–0.08)

## 2015-03-02 LAB — BASIC METABOLIC PANEL
Anion gap: 5 (ref 5–15)
BUN: 8 mg/dL (ref 6–23)
CHLORIDE: 103 mmol/L (ref 96–112)
CO2: 31 mmol/L (ref 19–32)
CREATININE: 1.18 mg/dL (ref 0.50–1.35)
Calcium: 9.3 mg/dL (ref 8.4–10.5)
GFR calc non Af Amer: 80 mL/min — ABNORMAL LOW (ref >90)
Glucose, Bld: 139 mg/dL — ABNORMAL HIGH (ref 70–99)
Potassium: 4.1 mmol/L (ref 3.5–5.1)
Sodium: 139 mmol/L (ref 135–145)

## 2015-03-02 NOTE — ED Provider Notes (Signed)
CSN: [id number]     Arrival date & time 03/02/15  2008 History  This chart was scribed for Linton Flemings, MD by Rayfield Citizen, ED Scribe. This patient was seen in room A10C/A10C and the patient's care was started at 11:28 PM.    Chief Complaint  Patient presents with  . Chest Pain   Patient is a 34 y.o. male presenting with chest pain. The history is provided by the patient. No language interpreter was used.  Chest Pain   HPI Comments: Colton Price is a 34 y.o. male who presents to the Emergency Department complaining of 2 days of "sharp" right-sided chest pain, exacerbated by deep breathing. He reports that 2 months PTA, he had a spontaneous pneumothorax and was concerned about tonight's pain for this reason, prompting him to come to the ED.   Patient also complains of right sided shoulder pain; he explains that he "cannot work" while taking the pain medication he was previously prescribed, as it makes him "crash." He is amenable to taking another medication for this issue.   Past Medical History  Diagnosis Date  . GERD (gastroesophageal reflux disease)   . Spontaneous pneumothorax    Past Surgical History  Procedure Laterality Date  . Mandible fracture surgery     History reviewed. No pertinent family history. History  Substance Use Topics  . Smoking status: Current Some Day Smoker    Types: Cigarettes  . Smokeless tobacco: Not on file  . Alcohol Use: Yes     Comment: occasional    Review of Systems  Cardiovascular: Positive for chest pain.  Musculoskeletal:       Right shoulder pain  All other systems reviewed and are negative.   Allergies  Fish allergy and Fruit & vegetable daily  Home Medications   Prior to Admission medications   Medication Sig Start Date End Date Taking? Authorizing Provider  diazepam (VALIUM) 2 MG tablet Take 1 tablet (2 mg total) by mouth every 12 (twelve) hours as needed for muscle spasms. 02/19/15   Everlene Balls, MD  ibuprofen  (ADVIL,MOTRIN) 800 MG tablet Take 1 tablet (800 mg total) by mouth every 8 (eight) hours as needed for moderate pain. 02/19/15   Everlene Balls, MD  Oxycodone HCl 10 MG TABS Take 1 tablet (10 mg total) by mouth every 4 (four) hours as needed. 10/28/14   Gaye Pollack, MD  traMADol (ULTRAM) 50 MG tablet Take 1 tablet (50 mg total) by mouth every 6 (six) hours as needed for moderate pain. 11/05/14   Grace Isaac, MD   BP 128/80 mmHg  Pulse 63  Temp(Src) 98 F (36.7 C) (Oral)  Resp 11  Wt 160 lb (72.576 kg)  SpO2 100% Physical Exam  Constitutional: He is oriented to person, place, and time. He appears well-developed and well-nourished. No distress.  HENT:  Head: Normocephalic and atraumatic.  Mouth/Throat: Oropharynx is clear and moist. No oropharyngeal exudate.  Moist mucous membranes  Eyes: EOM are normal. Pupils are equal, round, and reactive to light.  Neck: Normal range of motion. Neck supple. No JVD present.  Cardiovascular: Normal rate, regular rhythm and normal heart sounds.  Exam reveals no gallop and no friction rub.   No murmur heard. Pulmonary/Chest: Effort normal and breath sounds normal. No respiratory distress. He has no wheezes. He has no rales. He exhibits tenderness (Parasternal tenderness).  Abdominal: Soft. Bowel sounds are normal. He exhibits no mass. There is no tenderness. There is no rebound and no guarding.  Musculoskeletal: Normal range of motion. He exhibits no edema.  Moves all extremities normally.   Lymphadenopathy:    He has no cervical adenopathy.  Neurological: He is alert and oriented to person, place, and time. He displays normal reflexes.  Skin: Skin is warm and dry. No rash noted.  Psychiatric: He has a normal mood and affect. His behavior is normal.  Nursing note and vitals reviewed.   ED Course  Procedures   DIAGNOSTIC STUDIES: Oxygen Saturation is 100% on RA, normal by my interpretation.    COORDINATION OF CARE: 12:11 AM Discussed  treatment plan with pt at bedside and pt agreed to plan.   Labs Review Labs Reviewed  BASIC METABOLIC PANEL - Abnormal; Notable for the following:    Glucose, Bld 139 (*)    GFR calc non Af Amer 80 (*)    All other components within normal limits  CBC  I-STAT TROPOININ, ED    Imaging Review Dg Chest 2 View  03/02/2015   CLINICAL DATA:  Right-sided chest pain, 2 day duration. History of spontaneous pneumothorax.  EXAM: CHEST  2 VIEW  COMPARISON:  11/05/2014  FINDINGS: There is no pneumothorax. The lungs are clear. Hilar, mediastinal and cardiac contours are normal and unchanged. There is no effusion.  IMPRESSION: No active cardiopulmonary disease.   Electronically Signed   By: Andreas Newport M.D.   On: 03/02/2015 21:43     EKG Interpretation   Date/Time:  Tuesday March 02 2015 20:19:51 EDT Ventricular Rate:  69 PR Interval:  130 QRS Duration: 86 QT Interval:  352 QTC Calculation: 377 R Axis:   85 Text Interpretation:  Normal sinus rhythm Normal ECG Confirmed by Venna Berberich   MD, Hanh Kertesz ([id number]) on 03/03/2015 1:09:43 AM      MDM   Final diagnoses:  Chest wall pain   34 year old male with central and right-sided chest pain for the last 2 days.  Patient was concern for possible pneumothorax, as he had one 2 months ago.  Patient seen in the emergency department on the fourth with right shoulder pain.  Pain is worse with deep breathing and palpation.  EKG and labs and chest x-ray unremarkable.  Patient instructed to take ibuprofen prescribed him on earlier visit.  He does not like the way value makes him feel, will switch to Robaxin.  I personally performed the services described in this documentation, which was scribed in my presence. The recorded information has been reviewed and is accurate.       Linton Flemings, MD 03/03/15 0110

## 2015-03-02 NOTE — ED Notes (Addendum)
Presents with onset of right sided chest pain desccribed as sharp began yesterday-pt has histroy of sponateous pnuemothorax 2 months ago. This painis oless severe. sats 100%, breath sounds clear in all fields. Pain is worse with deep breathing.

## 2015-03-03 MED ORDER — METHOCARBAMOL 750 MG PO TABS: 750.0000 mg | ORAL_TABLET | Freq: Four times a day (QID) | ORAL | Status: DC | PRN

## 2015-03-03 MED ORDER — TRAMADOL HCL 50 MG PO TABS: 50.0000 mg | ORAL_TABLET | Freq: Four times a day (QID) | ORAL | Status: DC | PRN

## 2015-03-03 NOTE — Discharge Instructions (Signed)

## 2015-03-03 NOTE — ED Notes (Signed)
Pt a/o x 4 on d/c with friend.

## 2015-06-04 ENCOUNTER — Emergency Department (HOSPITAL_COMMUNITY)
Admission: EM | Admit: 2015-06-04 | Discharge: 2015-06-04 | Disposition: A | Discharge status: Home / Self Care | Payer: Self-pay | Attending: Emergency Medicine | Admitting: Emergency Medicine

## 2015-06-04 ENCOUNTER — Encounter (HOSPITAL_COMMUNITY): Payer: Self-pay | Admitting: Family Medicine

## 2015-06-04 DIAGNOSIS — M545 Low back pain, unspecified: Secondary | ICD-10-CM

## 2015-06-04 DIAGNOSIS — Z8719 Personal history of other diseases of the digestive system: Secondary | ICD-10-CM | POA: Insufficient documentation

## 2015-06-04 DIAGNOSIS — Z720 Tobacco use: Secondary | ICD-10-CM | POA: Insufficient documentation

## 2015-06-04 DIAGNOSIS — Z8709 Personal history of other diseases of the respiratory system: Secondary | ICD-10-CM | POA: Insufficient documentation

## 2015-06-04 MED ORDER — CYCLOBENZAPRINE HCL 10 MG PO TABS: 10.0000 mg | ORAL_TABLET | Freq: Two times a day (BID) | ORAL | Status: DC | PRN

## 2015-06-04 NOTE — ED Notes (Signed)
PA at bedside.

## 2015-06-04 NOTE — Discharge Instructions (Signed)
Return to the emergency room with worsening of symptoms, new symptoms or with symptoms that are concerning , especially fevers, loss of control of bladder or bowels, numbness or tingling around genital region or anus, weakness. RICE: Rest, Ice (three cycles of 20 mins on, 33mns off at least twice a day), compression/brace, elevation. Heating pad works well for back pain. Ibuprofen 4074m(2 tablets 20096mevery 5-6 hours for 3-5 days. Flexeril for severe pain at night. Do not operate machinery, drive or drink alcohol while taking narcotics or muscle relaxers. Follow up with PCP/orthopedist if symptoms worsen or are persistent. Read below information and follow recommendations.  Back Injury Prevention Back injuries can be extremely painful and difficult to heal. After having one back injury, you are much more likely to experience another later on. It is important to learn how to avoid injuring or re-injuring your back. The following tips can help you to prevent a back injury. PHYSICAL FITNESS  Exercise regularly and try to develop good tone in your abdominal muscles. Your abdominal muscles provide a lot of the support needed by your back.  Do aerobic exercises (walking, jogging, biking, swimming) regularly.  Do exercises that increase balance and strength (tai chi, yoga) regularly. This can decrease your risk of falling and injuring your back.  Stretch before and after exercising.  Maintain a healthy weight. The more you weigh, the more stress is placed on your back. For every pound of weight, 10 times that amount of pressure is placed on the back. DIET  Talk to your caregiver about how much calcium and vitamin D you need per day. These nutrients help to prevent weakening of the bones (osteoporosis). Osteoporosis can cause broken (fractured) bones that lead to back pain.  Include good sources of calcium in your diet, such as dairy products, green, leafy vegetables, and products with calcium  added (fortified).  Include good sources of vitamin D in your diet, such as milk and foods that are fortified with vitamin D.  Consider taking a nutritional supplement or a multivitamin if needed.  Stop smoking if you smoke. POSTURE  Sit and stand up straight. Avoid leaning forward when you sit or hunching over when you stand.  Choose chairs with good low back (lumbar) support.  If you work at a desk, sit close to your work so you do not need to lean over. Keep your chin tucked in. Keep your neck drawn back and elbows bent at a right angle. Your arms should look like the letter "L."  Sit high and close to the steering wheel when you drive. Add a lumbar support to your car seat if needed.  Avoid sitting or standing in one position for too long. Take breaks to get up, stretch, and walk around at least once every hour. Take breaks if you are driving for long periods of time.  Sleep on your side with your knees slightly bent, or sleep on your back with a pillow under your knees. Do not sleep on your stomach. LIFTING, TWISTING, AND REACHING  Avoid heavy lifting, especially repetitive lifting. If you must do heavy lifting:  Stretch before lifting.  Work slowly.  Rest between lifts.  Use carts and dollies to move objects when possible.  Make several small trips instead of carrying 1 heavy load.  Ask for help when you need it.  Ask for help when moving big, awkward objects.  Follow these steps when lifting:  Stand with your feet shoulder-width apart.  Get as close  to the object as you can. Do not try to pick up heavy objects that are far from your body.  Use handles or lifting straps if they are available.  Bend at your knees. Squat down, but keep your heels off the floor.  Keep your shoulders pulled back, your chin tucked in, and your back straight.  Lift the object slowly, tightening the muscles in your legs, abdomen, and buttocks. Keep the object as close to the center of  your body as possible.  When you put a load down, use these same guidelines in reverse.  Do not:  Lift the object above your waist.  Twist at the waist while lifting or carrying a load. Move your feet if you need to turn, not your waist.  Bend over without bending at your knees.  Avoid reaching over your head, across a table, or for an object on a high surface. OTHER TIPS  Avoid wet floors and keep sidewalks clear of ice to prevent falls.  Do not sleep on a mattress that is too soft or too hard.  Keep items that are used frequently within easy reach.  Put heavier objects on shelves at waist level and lighter objects on lower or higher shelves.  Find ways to decrease your stress, such as exercise, massage, or relaxation techniques. Stress can build up in your muscles. Tense muscles are more vulnerable to injury.  Seek treatment for depression or anxiety if needed. These conditions can increase your risk of developing back pain. SEEK MEDICAL CARE IF:  You injure your back.  You have questions about diet, exercise, or other ways to prevent back injuries. MAKE SURE YOU:  Understand these instructions.  Will watch your condition.  Will get help right away if you are not doing well or get worse. Document Released: 01/11/2005 Document Revised: 02/26/2012 Document Reviewed: 01/15/2012 [facility] Patient Information 2015 [place], [place]. This information is not intended to replace advice given to you by your health care provider. Make sure you discuss any questions you have with your health care provider.

## 2015-06-04 NOTE — ED Provider Notes (Signed)
CSN: [id number]     Arrival date & time 06/04/15  1801 History  This chart was scribed for non-physician practitioner Al Corpus, PA-C working with Lacretia Leigh, MD by Meriel Pica, ED Scribe. This patient was seen in room TR05C/TR05C and the patient's care was started at 6:59 PM.   Chief Complaint  Patient presents with  . Back Pain   The history is provided by the patient. No language interpreter was used.   HPI Comments: Colton Price is a 34 y.o. male who presents to the Emergency Department complaining of sudden onset, constant, moderate left-lower back pain that began today. He notes he went to plug in his phone today and felt something pull in his lower back. The pain is exacerbated on any type of movement. He has taken Excedrin pta with no relief. Pt has had similar back pain in the past when he was seen in the ED and prescribed Tramadol and Robaxin which he states provided relief. Denies numbness, tingling, fevers, chills, bowel or bladder incontinence, abdominal pain, nausea, vomiting, diaphoresis, weight loss, or history of IV drug abuse.   Past Medical History  Diagnosis Date  . GERD (gastroesophageal reflux disease)   . Spontaneous pneumothorax    Past Surgical History  Procedure Laterality Date  . Mandible fracture surgery     History reviewed. No pertinent family history. History  Substance Use Topics  . Smoking status: Current Some Day Smoker    Types: Cigarettes  . Smokeless tobacco: Not on file  . Alcohol Use: Yes     Comment: occasional    Review of Systems  Constitutional: Negative for fever, chills, diaphoresis and unexpected weight change.  Gastrointestinal: Negative for nausea, vomiting and abdominal pain.  Musculoskeletal: Positive for back pain and arthralgias.  Neurological: Negative for weakness and numbness.    Allergies  Fish allergy and Fruit & vegetable daily  Home Medications   Prior to Admission medications   Medication  Sig Start Date End Date Taking? Authorizing Provider  cyclobenzaprine (FLEXERIL) 10 MG tablet Take 1 tablet (10 mg total) by mouth 2 (two) times daily as needed for muscle spasms. 06/04/15   Al Corpus, PA-C  ibuprofen (ADVIL,MOTRIN) 800 MG tablet Take 1 tablet (800 mg total) by mouth every 8 (eight) hours as needed for moderate pain. 02/19/15   Everlene Balls, MD  methocarbamol (ROBAXIN-750) 750 MG tablet Take 1 tablet (750 mg total) by mouth every 6 (six) hours as needed for muscle spasms. 03/03/15   Linton Flemings, MD  Oxycodone HCl 10 MG TABS Take 1 tablet (10 mg total) by mouth every 4 (four) hours as needed. Patient not taking: Reported on 03/03/2015 10/28/14   Gaye Pollack, MD  traMADol (ULTRAM) 50 MG tablet Take 1 tablet (50 mg total) by mouth every 6 (six) hours as needed for moderate pain. 03/03/15   Linton Flemings, MD   BP 134/85 mmHg  Pulse 70  Temp(Src) 97.8 F (36.6 C) (Oral)  Resp 20  SpO2 98%  Physical Exam  Constitutional: He appears well-developed and well-nourished. No distress.  HENT:  Head: Normocephalic and atraumatic.  Eyes: Conjunctivae are normal. Right eye exhibits no discharge. Left eye exhibits no discharge.  Cardiovascular: Normal rate, regular rhythm and normal heart sounds.   Pulmonary/Chest: Effort normal and breath sounds normal. No respiratory distress. He has no wheezes.  Abdominal: Soft. Bowel sounds are normal. He exhibits no distension. There is no tenderness.  Musculoskeletal:  No midline back tenderness, step off or crepitus.  Left sided lower back tenderness. No CVA tenderness.   Neurological: He is alert. Coordination normal.  Equal muscle tone. 5/5 strength in lower extremities. DTR equal and intact. Negative straight leg test. Antalgic gait.   Skin: Skin is warm and dry. He is not diaphoretic.  Nursing note and vitals reviewed.   ED Course  Procedures  DIAGNOSTIC STUDIES: Oxygen Saturation is 98% on RA, normal by my interpretation.     COORDINATION OF CARE: 7:04 PM Discussed treatment plan which includes to prescribe Flexeril. Advised pt to follow up with an orthopedist for persistent symptoms, referral given. Pt acknowledges and agrees to plan.   Labs Review Labs Reviewed - No data to display  Imaging Review No results found.   EKG Interpretation None      MDM   Final diagnoses:  Left-sided low back pain without sciatica   Patient presenting with left low back pain after bending over. VSS. Normal neurological exam. Patient ambulatory. I doubt cauda equina. Patient educated on rice and ibuprofen use as well as given a prescription for Flexeril. Driving and sedation precautions provided. Patient to follow-up with orthopedics for persistent symptoms.  Discussed return precautions with patient. Patient verbalizes understanding and agrees with plan.  I personally performed the services described in this documentation, which was scribed in my presence. The recorded information has been reviewed and is accurate.    Al Corpus, PA-C 06/04/15 1947  Lacretia Leigh, MD 06/05/15 2129

## 2015-06-04 NOTE — ED Notes (Signed)
Pt here for lower back pain. sts he was bending over to plug a cell phone in when it started. 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

## 2016-03-17 IMAGING — CR DG CHEST 1V PORT
1 series · 1 of 1 positions shown · non-contrast
Comparison: 10/13/2014

CLINICAL DATA: Left-sided chest tube placement

EXAM:
PORTABLE CHEST - 1 VIEW

[ap]
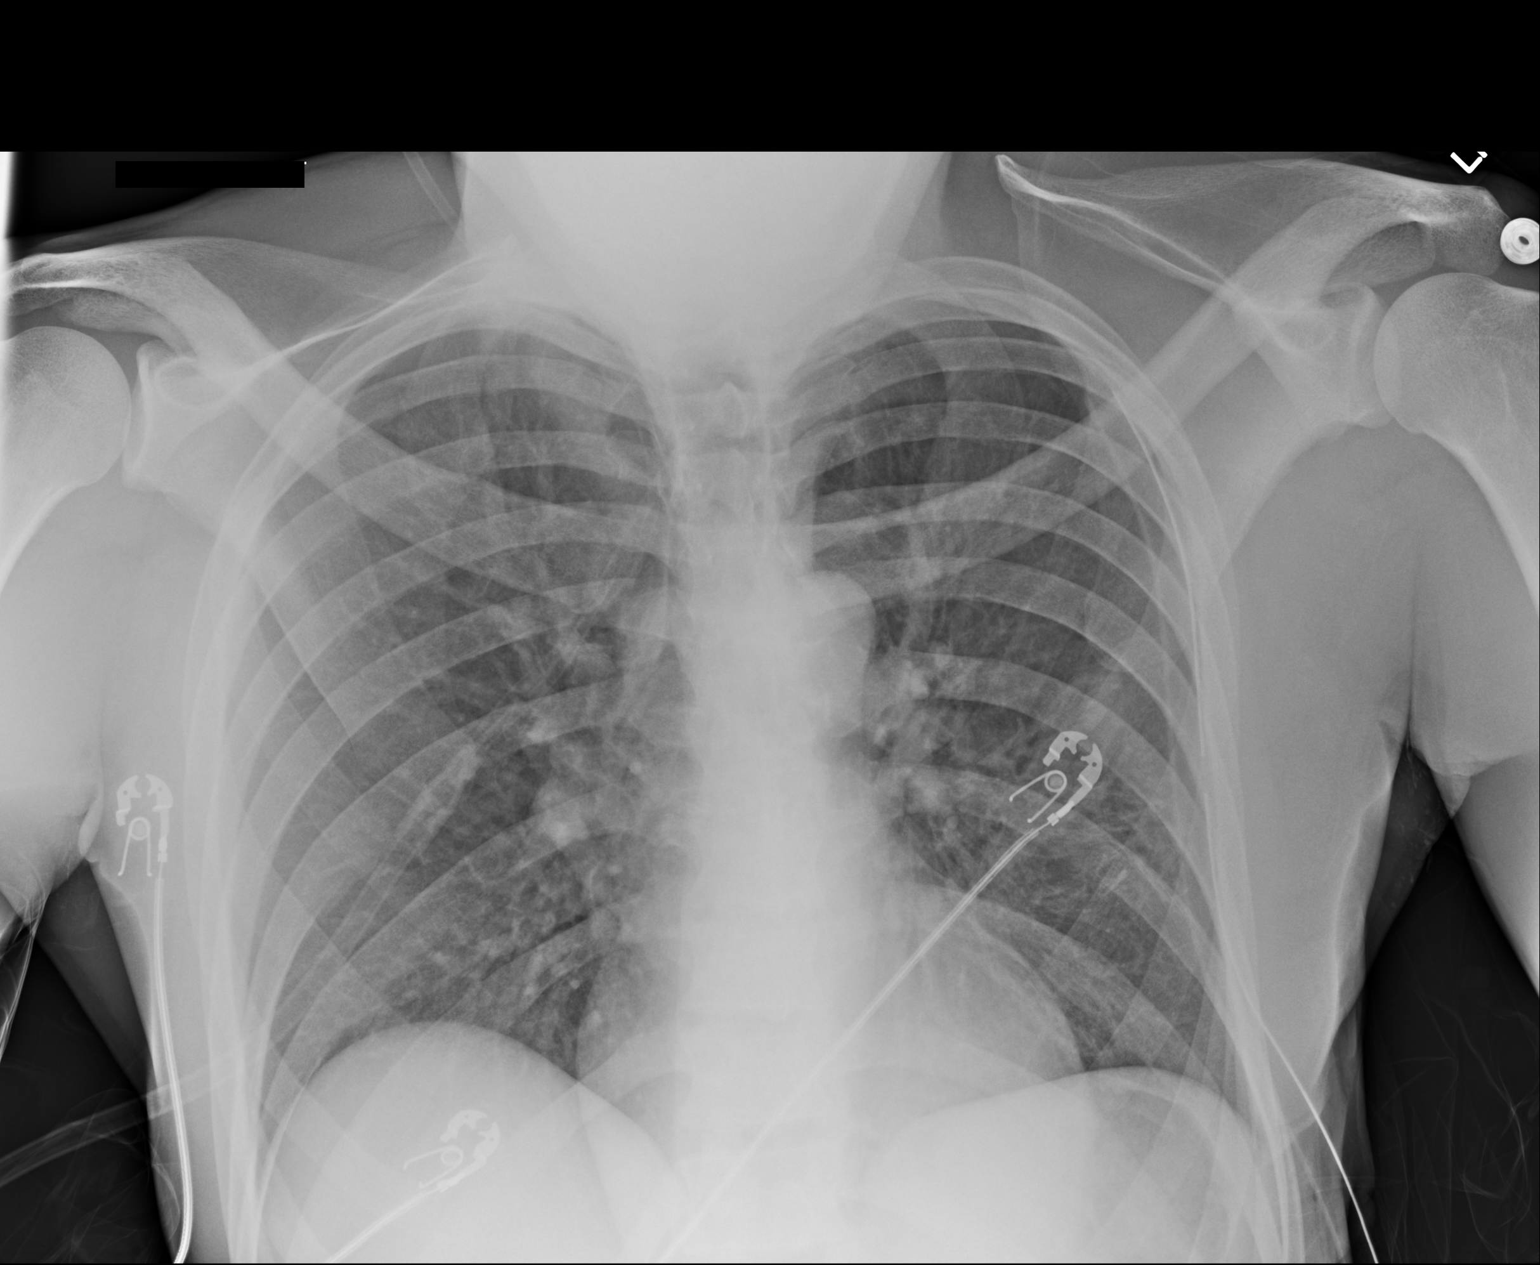

[1 of 1 positions shown; findings below may reference images not displayed]

FINDINGS: Interval left chest tube placement and left lung re-expansion. No
significant residual pneumothorax. Right lung remains clear.
Cardiomediastinal contours within normal range. No acute osseous
finding.
IMPRESSION: Interval left chest tube placement and left lung re-expansion.

## 2016-03-19 IMAGING — CR DG CHEST 2V
3 series · 3 of 3 positions shown · non-contrast
Comparison: 10/14/2014

CLINICAL DATA: Spontaneous pneumothorax

EXAM:
CHEST  2 VIEW

[w chest lat (1 of 2)]
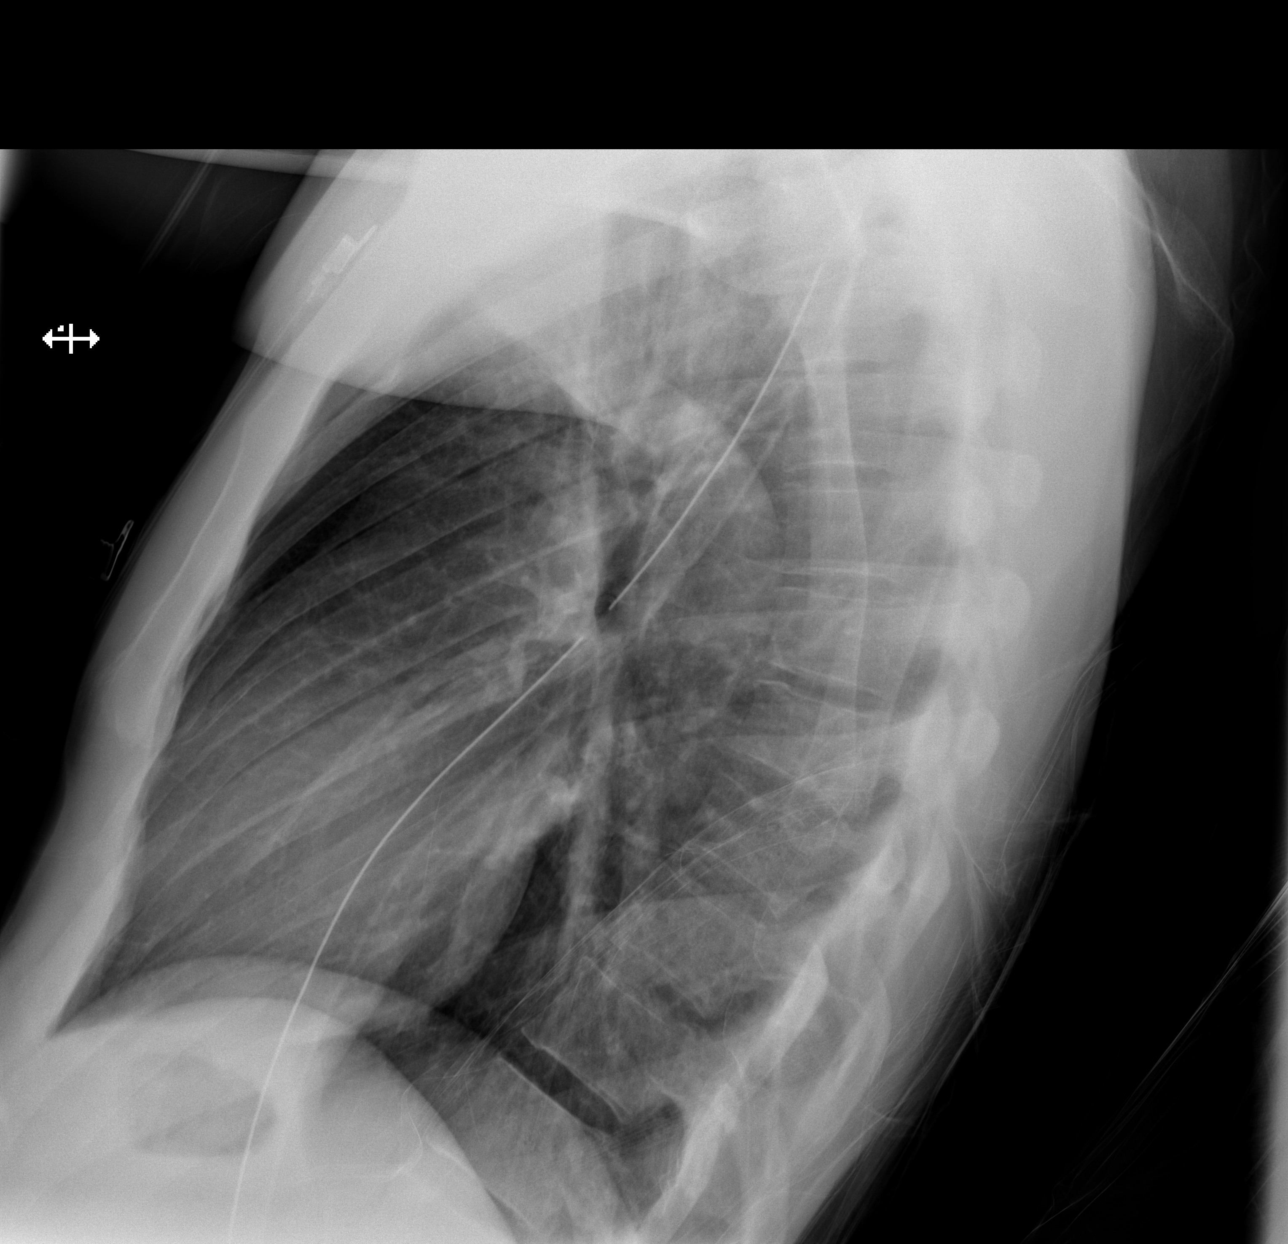

[w chest lat (2 of 2)]
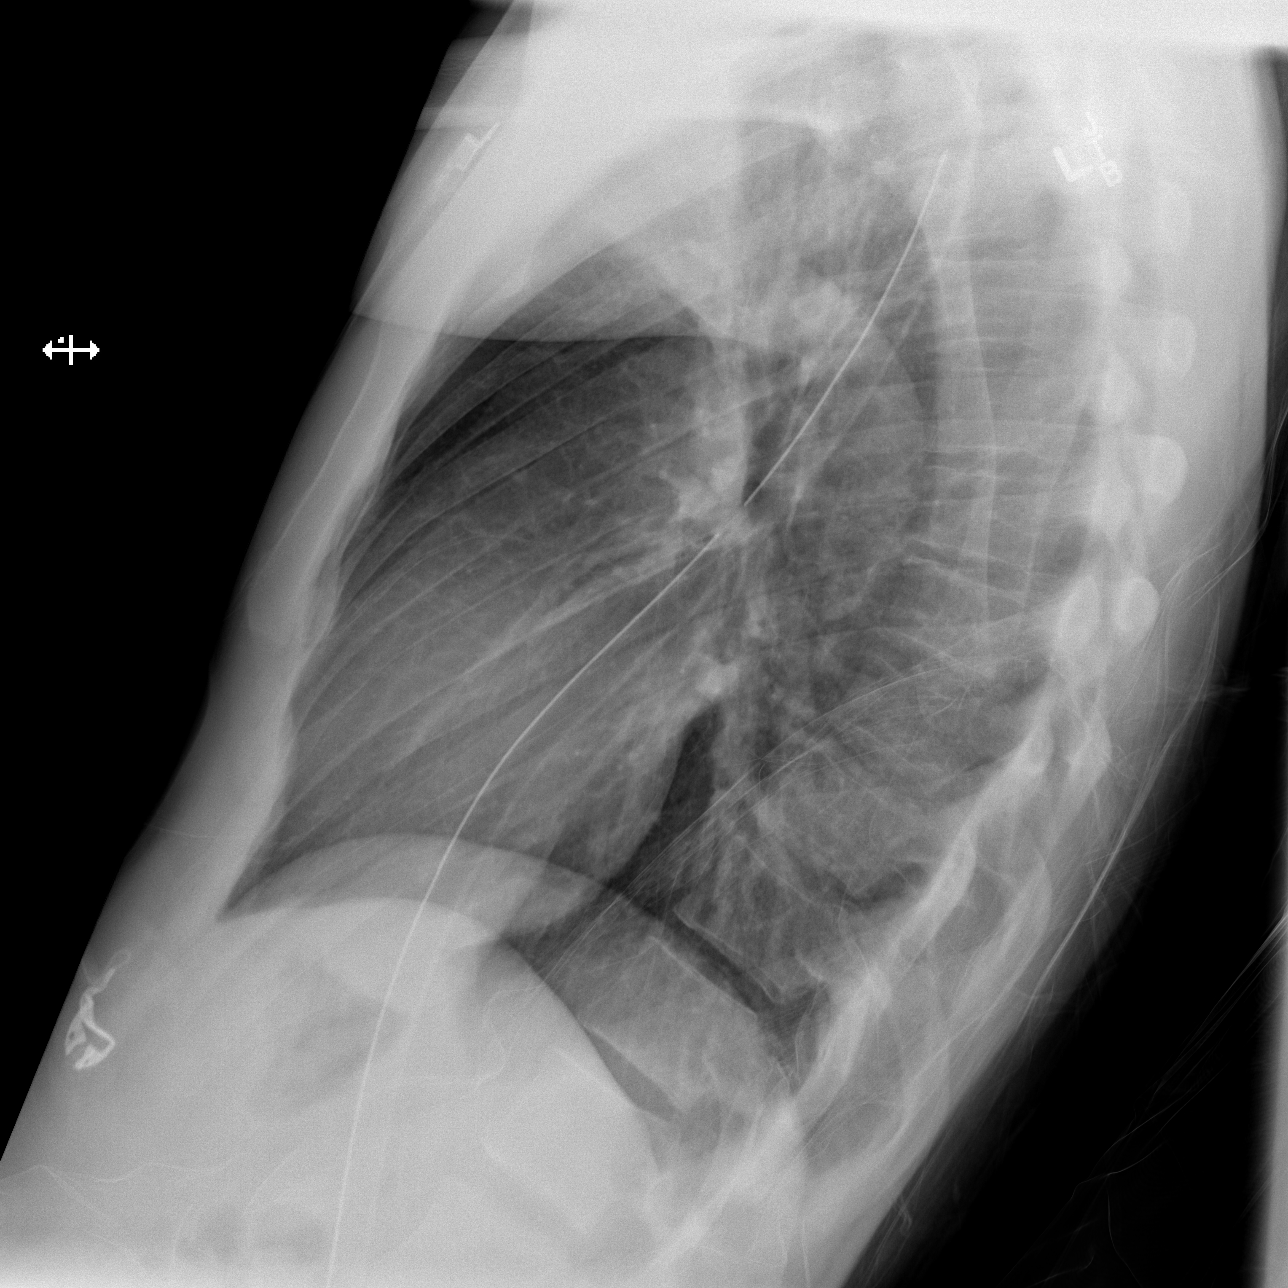

[x chest ap]
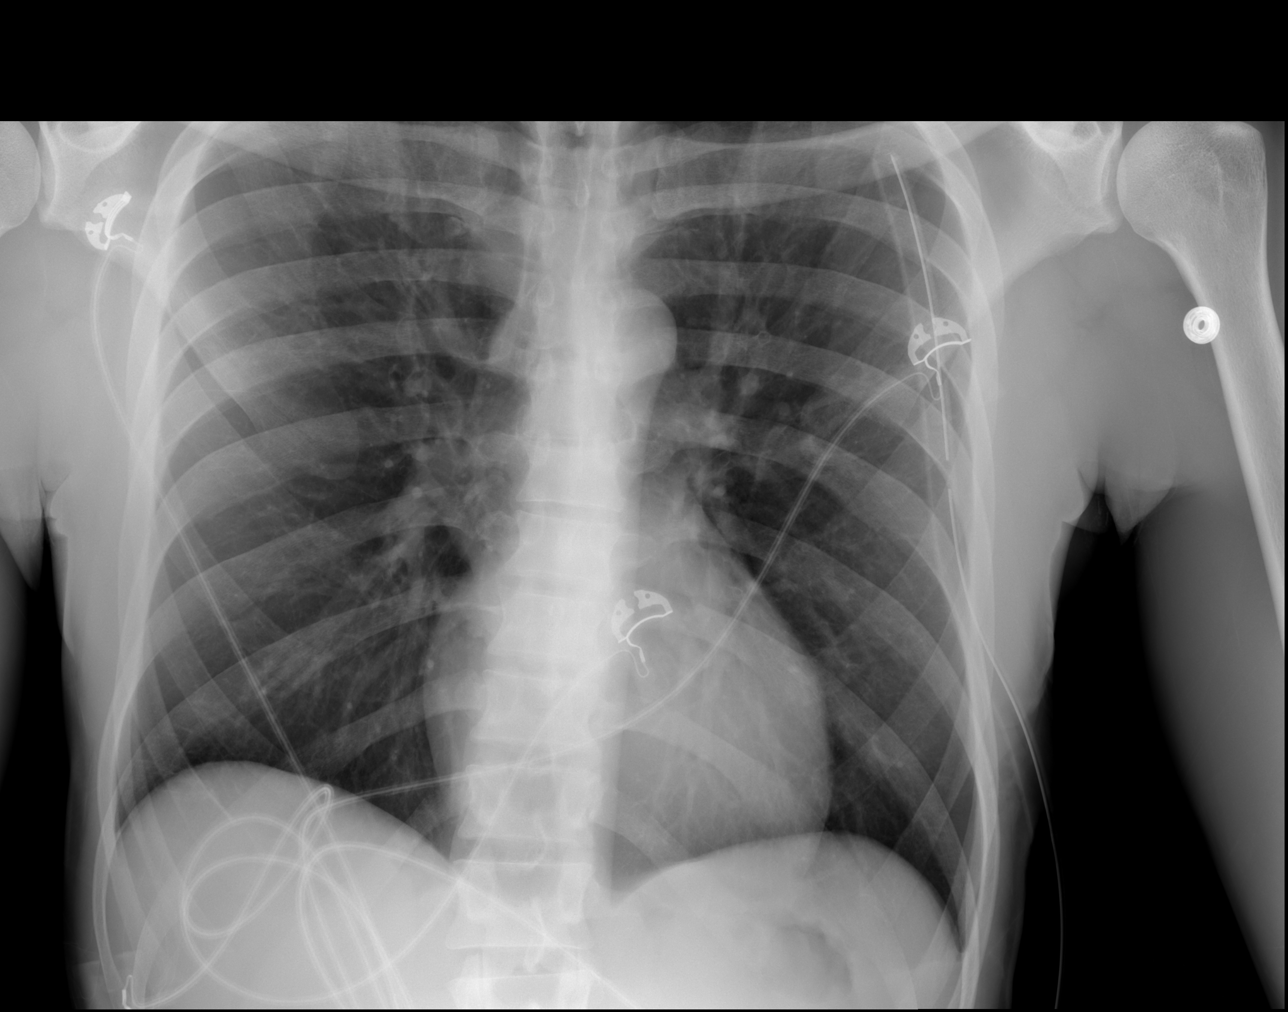

[3 of 3 positions shown; findings below may reference images not displayed]

FINDINGS: Tiny left apical pneumothorax is 5% and slightly worse. Left chest
tube is stable. Hyperaeration. Normal heart size. Clear lungs.
IMPRESSION: 5% left apical pneumothorax is slightly worse.

## 2016-04-22 ENCOUNTER — Emergency Department (HOSPITAL_COMMUNITY)
Admission: EM | Admit: 2016-04-22 | Discharge: 2016-04-22 | Disposition: A | Discharge status: Home / Self Care | Payer: Self-pay | Attending: Emergency Medicine | Admitting: Emergency Medicine

## 2016-04-22 ENCOUNTER — Encounter (HOSPITAL_COMMUNITY): Payer: Self-pay | Admitting: Emergency Medicine

## 2016-04-22 ENCOUNTER — Emergency Department (HOSPITAL_COMMUNITY): Payer: Self-pay

## 2016-04-22 DIAGNOSIS — K219 Gastro-esophageal reflux disease without esophagitis: Secondary | ICD-10-CM | POA: Insufficient documentation

## 2016-04-22 DIAGNOSIS — R0789 Other chest pain: Secondary | ICD-10-CM | POA: Insufficient documentation

## 2016-04-22 DIAGNOSIS — Z79899 Other long term (current) drug therapy: Secondary | ICD-10-CM | POA: Insufficient documentation

## 2016-04-22 DIAGNOSIS — R202 Paresthesia of skin: Secondary | ICD-10-CM

## 2016-04-22 DIAGNOSIS — R209 Unspecified disturbances of skin sensation: Secondary | ICD-10-CM | POA: Insufficient documentation

## 2016-04-22 DIAGNOSIS — F17210 Nicotine dependence, cigarettes, uncomplicated: Secondary | ICD-10-CM | POA: Insufficient documentation

## 2016-04-22 LAB — BASIC METABOLIC PANEL
Anion gap: 10 (ref 5–15)
BUN: 12 mg/dL (ref 6–20)
CHLORIDE: 104 mmol/L (ref 101–111)
CO2: 25 mmol/L (ref 22–32)
Calcium: 9.8 mg/dL (ref 8.9–10.3)
Creatinine, Ser: 1.05 mg/dL (ref 0.61–1.24)
GFR calc Af Amer: >60 mL/min (ref >60)
GFR calc non Af Amer: >60 mL/min (ref >60)
Glucose, Bld: 92 mg/dL (ref 65–99)
Potassium: 3.7 mmol/L (ref 3.5–5.1)
SODIUM: 139 mmol/L (ref 135–145)

## 2016-04-22 LAB — CBC
HCT: 47.8 % (ref 39.0–52.0)
Hemoglobin: 16.4 g/dL (ref 13.0–17.0)
MCH: 28.8 pg (ref 26.0–34.0)
MCHC: 34.3 g/dL (ref 30.0–36.0)
MCV: 83.9 fL (ref 78.0–100.0)
PLATELETS: 229 10*3/uL (ref 150–400)
RBC: 5.7 MIL/uL (ref 4.22–5.81)
RDW: 13.3 % (ref 11.5–15.5)
WBC: 10.9 10*3/uL — ABNORMAL HIGH (ref 4.0–10.5)

## 2016-04-22 LAB — I-STAT TROPONIN, ED: Troponin i, poc: 0 ng/mL (ref 0.00–0.08)

## 2016-04-22 MED ORDER — GI COCKTAIL ~~LOC~~
30.0000 mL | Freq: Once | ORAL | Status: AC
  Administered 2016-04-22: 30 mL via ORAL
  Filled 2016-04-22: qty 30

## 2016-04-22 NOTE — ED Notes (Signed)
Patient transported to X-ray 

## 2016-04-22 NOTE — Discharge Instructions (Signed)
Follow-up with your primary care provider regarding your visit to the emergency department today and your elevated blood pressure.  Take over-the-counter Prilosec for your GERD.  Return to emergency department if he experienced worsening chest pain, shortness of breath, nausea, vomiting, weakness.  Chest Wall Pain Chest wall pain is pain in or around the bones and muscles of your chest. Sometimes, an injury causes this pain. Sometimes, the cause may not be known. This pain may take several weeks or longer to get better. HOME CARE INSTRUCTIONS  Pay attention to any changes in your symptoms. Take these actions to help with your pain:   Rest as told by your health care provider.   Avoid activities that cause pain. These include any activities that use your chest muscles or your abdominal and side muscles to lift heavy items.   If directed, apply ice to the painful area:  Put ice in a plastic bag.  Place a towel between your skin and the bag.  Leave the ice on for 20 minutes, 2-3 times per day.  Take over-the-counter and prescription medicines only as told by your health care provider.  Do not use tobacco products, including cigarettes, chewing tobacco, and e-cigarettes. If you need help quitting, ask your health care provider.  Keep all follow-up visits as told by your health care provider. This is important. SEEK MEDICAL CARE IF:  You have a fever.  Your chest pain becomes worse.  You have new symptoms. SEEK IMMEDIATE MEDICAL CARE IF: You have nausea or vomiting. Paresthesia Paresthesia is an abnormal burning or prickling sensation. This sensation is generally felt in the hands, arms, legs, or feet. However, it may occur in any part of the body. Usually, it is not painful. The feeling may be described as: Tingling or numbness. Pins and needles. Skin crawling. Buzzing. Limbs falling asleep. Itching. Most people experience temporary (transient) paresthesia at some time in  their lives. Paresthesia may occur when you breathe too quickly (hyperventilation). It can also occur without any apparent cause. Commonly, paresthesia occurs when pressure is placed on a nerve. The sensation quickly goes away after the pressure is removed. For some people, however, paresthesia is a long-lasting (chronic) condition that is caused by an underlying disorder. If you continue to have paresthesia, you may need further medical evaluation. HOME CARE INSTRUCTIONS Watch your condition for any changes. Taking the following actions may help to lessen any discomfort that you are feeling: Avoid drinking alcohol. Try acupuncture or massage to help relieve your symptoms. Keep all follow-up visits as directed by your health care provider. This is important. SEEK MEDICAL CARE IF: You continue to have episodes of paresthesia. Your burning or prickling feeling gets worse when you walk. You have pain, cramps, or dizziness. You develop a rash. SEEK IMMEDIATE MEDICAL CARE IF: You feel weak. You have trouble walking or moving. You have problems with speech, understanding, or vision. You feel confused. You cannot control your bladder or bowel movements. You have numbness after an injury. You faint.   This information is not intended to replace advice given to you by your health care provider. Make sure you discuss any questions you have with your health care provider.   Document Released: 11/24/2002 Document Revised: 04/20/2015 Document Reviewed: 11/30/2014 Elsevier Interactive Patient Education 2016 [place] feel sweaty or light-headed.  You have a cough with phlegm (sputum) or you cough up blood.  You develop shortness of breath.   This information is not intended to  replace advice given to you by your health care provider. Make sure you discuss any questions you have with your health care provider.   Document Released: 12/04/2005 Document Revised: 08/25/2015 Document  Reviewed: 03/01/2015 Elsevier Interactive Patient Education [employer].
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
## 2016-04-22 NOTE — ED Notes (Signed)
Per pt, states chest pain and left foot numbness since yesterday

## 2016-07-16 DIAGNOSIS — R0789 Other chest pain: Secondary | ICD-10-CM | POA: Insufficient documentation

## 2016-07-16 DIAGNOSIS — F17210 Nicotine dependence, cigarettes, uncomplicated: Secondary | ICD-10-CM | POA: Insufficient documentation

## 2016-07-17 ENCOUNTER — Emergency Department (HOSPITAL_COMMUNITY): Payer: Self-pay

## 2016-07-17 ENCOUNTER — Encounter (HOSPITAL_COMMUNITY): Payer: Self-pay | Admitting: Emergency Medicine

## 2016-07-17 ENCOUNTER — Emergency Department (HOSPITAL_COMMUNITY)
Admission: EM | Admit: 2016-07-17 | Discharge: 2016-07-17 | Disposition: A | Discharge status: Home / Self Care | Payer: Self-pay | Attending: Emergency Medicine | Admitting: Emergency Medicine

## 2016-07-17 DIAGNOSIS — R0789 Other chest pain: Secondary | ICD-10-CM

## 2016-07-17 LAB — CBC
HEMATOCRIT: 46.8 % (ref 39.0–52.0)
HEMOGLOBIN: 15.2 g/dL (ref 13.0–17.0)
MCH: 28.3 pg (ref 26.0–34.0)
MCHC: 32.5 g/dL (ref 30.0–36.0)
MCV: 87 fL (ref 78.0–100.0)
Platelets: 237 10*3/uL (ref 150–400)
RBC: 5.38 MIL/uL (ref 4.22–5.81)
RDW: 12.9 % (ref 11.5–15.5)
WBC: 9.8 10*3/uL (ref 4.0–10.5)

## 2016-07-17 LAB — BASIC METABOLIC PANEL
ANION GAP: 6 (ref 5–15)
BUN: 12 mg/dL (ref 6–20)
CALCIUM: 9.6 mg/dL (ref 8.9–10.3)
CO2: 29 mmol/L (ref 22–32)
Chloride: 104 mmol/L (ref 101–111)
Creatinine, Ser: 1.57 mg/dL — ABNORMAL HIGH (ref 0.61–1.24)
GFR calc non Af Amer: 56 mL/min — ABNORMAL LOW (ref >60)
Glucose, Bld: 95 mg/dL (ref 65–99)
POTASSIUM: 4.1 mmol/L (ref 3.5–5.1)
Sodium: 139 mmol/L (ref 135–145)

## 2016-07-17 LAB — I-STAT TROPONIN, ED: TROPONIN I, POC: 0 ng/mL (ref 0.00–0.08)

## 2016-07-17 LAB — D-DIMER, QUANTITATIVE: D-Dimer, Quant: <0.27 ug/mL-FEU (ref 0.00–0.50)

## 2016-07-17 MED ORDER — NAPROXEN 500 MG PO TABS: 500.0000 mg | ORAL_TABLET | Freq: Two times a day (BID) | ORAL | 0 refills | Status: DC

## 2016-07-17 MED ORDER — KETOROLAC TROMETHAMINE 15 MG/ML IJ SOLN
15.0000 mg | Freq: Once | INTRAMUSCULAR | Status: AC
  Administered 2016-07-17: 15 mg via INTRAMUSCULAR
  Filled 2016-07-17: qty 1

## 2016-07-17 NOTE — ED Triage Notes (Signed)
Pt. reports central chest tightness onset yesterday wiith mild SOB and dry cough , denies emesis or diaphoresis.

## 2016-07-17 NOTE — ED Provider Notes (Signed)
[facility] DEPT Provider Note   CSN: [id number] Arrival date & time: 07/16/16  2356  First MD Initiated Contact with Patient 07/17/16 0241     By signing my name below, I, Jasmyn B. Alexander, attest that this documentation has been prepared under the direction and in the presence of Merryl Hacker, MD.  Electronically Signed: Tedra Coupe. Sheppard Coil, ED Scribe. 07/17/16. 2:48 AM.  History   Chief Complaint Chief Complaint  Patient presents with  . Chest Pain    HPI HPI Comments: Colton Price is a 35 y.o. male with PMHx of GERD and spontaneous PTX who presents to the Emergency Department complaining of gradual onset, intermittent, 7/10, right-sided chest pain x 2 days. Pt has associated chest tightness and dry cough. He has taken 2 ASA with no relief. Pain is exacerbated with deep breaths. No recent long-distance travel or hx of DVT/PE. No daily medications. Denies any fever.  The history is provided by the patient. No language interpreter was used.    Past Medical History:  Diagnosis Date  . GERD (gastroesophageal reflux disease)   . Spontaneous pneumothorax     Patient Active Problem List   Diagnosis Date Noted  . Pneumothorax 10/13/2014    Past Surgical History:  Procedure Laterality Date  . MANDIBLE FRACTURE SURGERY      Home Medications    Prior to Admission medications   Medication Sig Start Date End Date Taking? Authorizing Provider  naproxen (NAPROSYN) 500 MG tablet Take 1 tablet (500 mg total) by mouth 2 (two) times daily. 07/17/16   Merryl Hacker, MD    Family History No family history on file.  Social History Social History  Substance Use Topics  . Smoking status: Current Some Day Smoker    Types: Cigarettes  . Smokeless tobacco: Not on file  . Alcohol use Yes     Comment: occasional     Allergies   Fish allergy and Fruit & vegetable daily [nutritional supplements]   Review of Systems Review of Systems  Constitutional:  Negative for fever.  Respiratory: Positive for cough and chest tightness.   Cardiovascular: Positive for chest pain. Negative for leg swelling.  Gastrointestinal: Negative for abdominal pain, nausea and vomiting.  All other systems reviewed and are negative.  Physical Exam Updated Vital Signs BP 130/90   Pulse 76   Temp 97.9 F (36.6 C) (Oral)   Resp 21   Ht 6\' 2"  (1.88 m)   Wt 154 lb (69.9 kg)   SpO2 100%   BMI 19.77 kg/m   Physical Exam  Constitutional: He is oriented to person, place, and time. He appears well-developed and well-nourished.  Tall  HENT:  Head: Normocephalic and atraumatic.  Eyes: Pupils are equal, round, and reactive to light.  Neck: Neck supple.  Cardiovascular: Normal rate, regular rhythm and normal heart sounds.   No murmur heard. Pulmonary/Chest: Effort normal and breath sounds normal. No respiratory distress. He has no wheezes. He exhibits no tenderness.  Abdominal: Soft. Bowel sounds are normal. There is no tenderness.  Musculoskeletal: He exhibits no edema.  Lymphadenopathy:    He has no cervical adenopathy.  Neurological: He is alert and oriented to person, place, and time.  Skin: Skin is warm and dry.  Psychiatric: He has a normal mood and affect.  Nursing note and vitals reviewed.    ED Treatments / Results  DIAGNOSTIC STUDIES: Oxygen Saturation is 97% on RA, normal by my interpretation.    COORDINATION OF CARE: 2:47 AM-Discussed  treatment plan which includes D-Dimer, Troponin and order of Toradol with pt at bedside and pt agreed to plan.   Labs (all labs ordered are listed, but only abnormal results are displayed) Labs Reviewed  BASIC METABOLIC PANEL - Abnormal; Notable for the following:       Result Value   Creatinine, Ser 1.57 (*)    GFR calc non Af Amer 56 (*)    All other components within normal limits  CBC  D-DIMER, QUANTITATIVE (NOT AT [facility])  I-STAT TROPOININ, ED    EKG  EKG Interpretation  Date/Time:  Monday July 17 2016 00:02:06 EDT Ventricular Rate:  71 PR Interval:  138 QRS Duration: 84 QT Interval:  360 QTC Calculation: 391 R Axis:   92 Text Interpretation:  Normal sinus rhythm Rightward axis Borderline ECG No significant change since last tracing Confirmed by Donyae Kohn  MD, Nayanna Seaborn ([id number]) on 07/17/2016 2:38:52 AM       Radiology Dg Chest 2 View  Result Date: 07/17/2016 CLINICAL DATA:  Chest tightness, mid chest pain. EXAM: CHEST  2 VIEW COMPARISON:  04/22/2016 FINDINGS: The cardiomediastinal contours are normal. The lungs are clear. Pulmonary vasculature is normal. No consolidation, pleural effusion, or pneumothorax. No acute osseous abnormalities are seen. IMPRESSION: No active cardiopulmonary disease. Electronically Signed   By: Jeb Levering M.D.   On: 07/17/2016 00:21  Procedures Procedures (including critical care time)  Medications Ordered in ED Medications  ketorolac (TORADOL) 15 MG/ML injection 15 mg (15 mg Intramuscular Given 07/17/16 0313)   Initial Impression / Assessment and Plan / ED Course  I have reviewed the triage vital signs and the nursing notes.  Pertinent labs & imaging results that were available during my care of the patient were reviewed by me and considered in my medical decision making (see chart for details).  Clinical Course   Patient presents with chest pain. Ongoing for the last 2 days. Somewhat pleuritic. No risk factors for PE. History of pneumothorax. Nontoxic-appearing. Vital signs reassuring. No reproducible pain on exam. Patient improved with Toradol. D-dimer negative. Suspect inflammatory or muscular component. Will place on naproxen twice a day.  Final Clinical Impressions(s) / ED Diagnoses   Final diagnoses:  Atypical chest pain    New Prescriptions New Prescriptions   NAPROXEN (NAPROSYN) 500 MG TABLET    Take 1 tablet (500 mg total) by mouth 2 (two) times daily.   I personally performed the services described in this documentation, which was  scribed in my presence. The recorded information has been reviewed and is accurate.     Merryl Hacker, MD 07/17/16 [phone]

## 2017-10-05 ENCOUNTER — Encounter (HOSPITAL_COMMUNITY): Payer: Self-pay | Admitting: Emergency Medicine

## 2017-10-05 ENCOUNTER — Emergency Department (HOSPITAL_COMMUNITY)
Admission: EM | Admit: 2017-10-05 | Discharge: 2017-10-06 | Disposition: A | Discharge status: Home / Self Care | Payer: Self-pay | Attending: Emergency Medicine | Admitting: Emergency Medicine

## 2017-10-05 DIAGNOSIS — Z23 Encounter for immunization: Secondary | ICD-10-CM | POA: Insufficient documentation

## 2017-10-05 DIAGNOSIS — Y998 Other external cause status: Secondary | ICD-10-CM | POA: Insufficient documentation

## 2017-10-05 DIAGNOSIS — S0181XA Laceration without foreign body of other part of head, initial encounter: Secondary | ICD-10-CM | POA: Insufficient documentation

## 2017-10-05 DIAGNOSIS — Z791 Long term (current) use of non-steroidal anti-inflammatories (NSAID): Secondary | ICD-10-CM | POA: Insufficient documentation

## 2017-10-05 DIAGNOSIS — Y929 Unspecified place or not applicable: Secondary | ICD-10-CM | POA: Insufficient documentation

## 2017-10-05 DIAGNOSIS — F17210 Nicotine dependence, cigarettes, uncomplicated: Secondary | ICD-10-CM | POA: Insufficient documentation

## 2017-10-05 DIAGNOSIS — W25XXXA Contact with sharp glass, initial encounter: Secondary | ICD-10-CM | POA: Insufficient documentation

## 2017-10-05 DIAGNOSIS — Y9389 Activity, other specified: Secondary | ICD-10-CM | POA: Insufficient documentation

## 2017-10-05 MED ORDER — LIDOCAINE-EPINEPHRINE (PF) 2 %-1:200000 IJ SOLN
10.0000 mL | Freq: Once | INTRAMUSCULAR | Status: AC
  Administered 2017-10-05: 10 mL
  Filled 2017-10-05: qty 20

## 2017-10-05 MED ORDER — TETANUS-DIPHTH-ACELL PERTUSSIS 5-2.5-18.5 LF-MCG/0.5 IM SUSP
0.5000 mL | Freq: Once | INTRAMUSCULAR | Status: AC
  Administered 2017-10-05: 0.5 mL via INTRAMUSCULAR
  Filled 2017-10-05: qty 0.5

## 2017-10-05 NOTE — Discharge Instructions (Signed)
WOUND CARE ° ° Keep area clean and dry for 24 hours. Do not remove °bandage, if applied. ° After 24 hours, remove bandage and wash wound °gently with mild soap and warm water. Reapply °a new bandage after cleaning wound, if directed. ° Continue daily cleansing with soap and water until °stitches/staples are removed. ° Do not apply any ointments or creams to the wound °while stitches/staples are in place, as this may cause °delayed healing. ° Seek medical careif you experience any of the following °signs of infection: Swelling, redness, pus drainage, °streaking, fever >101.0 F ° Seek care if you experience excessive bleeding °that does not stop after 15-20 minutes of constant, firm °pressure. ° ° °

## 2017-10-05 NOTE — ED Provider Notes (Signed)
[facility] EMERGENCY DEPARTMENT Provider Note   CSN: [id number] Arrival date & time: 10/05/17  1357     History   Chief Complaint No chief complaint on file.   HPI Colton Price is a 36 y.o. male presents emergency Department with chief complaint of multiple lacerations. Patient states that he was changing out a glass door when it slipped and fell backward and crashed onto his head causing lacerations to his forehead. He also has one to his left arm. Patient did not lose consciousness. He complains of excessive bleeding from the upper right laceration on his forehead that was spurting blood. In the lobby he became diaphoretic and was moved quickly to the back for assessment. He denies headache, neck pain, upper extremity weakness or paresthesia. He is unsure of his last tetanus vaccination.  HPI  Past Medical History:  Diagnosis Date  . GERD (gastroesophageal reflux disease)   . Spontaneous pneumothorax     Patient Active Problem List   Diagnosis Date Noted  . Pneumothorax 10/13/2014    Past Surgical History:  Procedure Laterality Date  . MANDIBLE FRACTURE SURGERY         Home Medications    Prior to Admission medications   Medication Sig Start Date End Date Taking? Authorizing Provider  naproxen (NAPROSYN) 500 MG tablet Take 1 tablet (500 mg total) by mouth 2 (two) times daily. 07/17/16   Horton, Barbette Hair, MD    Family History No family history on file.  Social History Social History  Substance Use Topics  . Smoking status: Current Some Day Smoker    Types: Cigarettes  . Smokeless tobacco: Not on file  . Alcohol use Yes     Comment: occasional     Allergies   Fish allergy and Fruit & vegetable daily [nutritional supplements]   Review of Systems Review of Systems  Ten systems reviewed and are negative for acute change, except as noted in the HPI.   Physical Exam Updated Vital Signs BP (!) 105/53 (BP Location: Right  Arm)   Pulse 97   Temp 99 F (37.2 C) (Oral)   Resp 18   SpO2 100%   Physical Exam  Constitutional: He is oriented to person, place, and time. He appears well-developed and well-nourished. No distress.  HENT:  Head: Normocephalic. Head is with laceration.  Laceration to the right upper lateral aspect of the patient's forehead with arterial bleed controlled after anesthesia with lidocaine and epinephrine and direct pressure Laceration to the central forehead Tissue avulsion above the right eyebrow  Eyes: Pupils are equal, round, and reactive to light. Conjunctivae and EOM are normal. No scleral icterus.  Neck: Normal range of motion. Neck supple.  Cardiovascular: Normal rate, regular rhythm and normal heart sounds.   Pulmonary/Chest: Effort normal and breath sounds normal. No respiratory distress.  Abdominal: Soft. There is no tenderness.  Musculoskeletal: He exhibits no edema.       Arms: Neurological: He is alert and oriented to person, place, and time. He displays normal reflexes. No cranial nerve deficit or sensory deficit. He exhibits normal muscle tone. Coordination normal.  Skin: Skin is warm and dry. He is not diaphoretic.  Psychiatric: His behavior is normal.  Nursing note and vitals reviewed.    ED Treatments / Results  Labs (all labs ordered are listed, but only abnormal results are displayed) Labs Reviewed - No data to display  EKG  EKG Interpretation None       Radiology No results  found.  Procedures .Marland KitchenLaceration Repair Date/Time: 10/05/2017 4:04 PM Performed by: Margarita Mail Authorized by: Margarita Mail   Consent:    Consent obtained:  Verbal   Consent given by:  Patient   Risks discussed:  Infection, nerve damage, vascular damage, retained foreign body, pain, need for additional repair and poor cosmetic result Anesthesia (see MAR for exact dosages):    Anesthesia method:  Local infiltration   Local anesthetic:  Lidocaine 2% WITH  epi Laceration details:    Location:  Face   Face location:  Forehead   Length (cm):  2   Depth (mm):  5 Repair type:    Repair type:  Intermediate Pre-procedure details:    Preparation:  Patient was prepped and draped in usual sterile fashion Exploration:    Hemostasis achieved with:  Epinephrine Treatment:    Area cleansed with:  Betadine   Amount of cleaning:  Standard Skin repair:    Repair method:  Sutures   Suture size:  5-0   Wound skin closure material used: vicryl rapide.   Suture technique:  Simple interrupted   Number of sutures:  3 Approximation:    Approximation:  Close Post-procedure details:    Dressing:  Bulky dressing   Patient tolerance of procedure:  Tolerated well, no immediate complications  .Marland KitchenLaceration Repair Date/Time: 10/05/2017 4:58 PM Performed by: Margarita Mail Authorized by: Margarita Mail   Consent:    Consent obtained:  Verbal   Consent given by:  Patient   Risks discussed:  Infection, retained foreign body and poor cosmetic result   Alternatives discussed:  No treatment Anesthesia (see MAR for exact dosages):    Anesthesia method:  Local infiltration   Local anesthetic:  Lidocaine 2% WITH epi Laceration details:    Location:  Face   Face location:  Forehead   Length (cm):  1   Depth (mm):  5 Repair type:    Repair type:  Intermediate Exploration:    Wound extent: foreign bodies/material (glass)     Contaminated: yes   Skin repair:    Repair method:  Sutures   Suture size:  5-0   Wound skin closure material used: vicryl rapide.   Suture technique:  Simple interrupted   Number of sutures:  1 Approximation:    Approximation:  Close   Vermilion border: well-aligned   Post-procedure details:    Dressing:  Bulky dressing   Patient tolerance of procedure:  Tolerated well, no immediate complications   (including critical care time)  Medications Ordered in ED Medications  lidocaine-EPINEPHrine (XYLOCAINE W/EPI) 2 %-1:200000  (PF) injection 10 mL (10 mLs Infiltration Given 10/05/17 1439)     Initial Impression / Assessment and Plan / ED Course  I have reviewed the triage vital signs and the nursing notes.  Pertinent labs & imaging results that were available during my care of the patient were reviewed by me and considered in my medical decision making (see chart for details).     Colton Price is a 36 y.o. male who presents to ED for laceration of forehead. Wound thoroughly cleaned in ED today. Wound explored and bottom of wound seen in a bloodless field. Laceration repaired as dictated above. Patient counseled on home wound care. . Patient was urged to return to the Emergency Department for worsening pain, swelling, expanding erythema especially if it streaks away from the affected area, fever, or for any additional concerns. Patient verbalized understanding. All questions answered.   Final Clinical Impressions(s) / ED Diagnoses  Final diagnoses:  Facial laceration, initial encounter    New Prescriptions New Prescriptions   No medications on file     Margarita Mail, PA-C 10/05/17 1719    Lajean Saver, MD 10/07/17 [phone]

## 2017-10-05 NOTE — ED Notes (Signed)
Pt reports running into glass door, two lacs noted to forehead with heavy blood flowing. Pressure dressing applied. Pt endorses lightheadedness.

## 2018-09-20 ENCOUNTER — Emergency Department (HOSPITAL_COMMUNITY)
Admission: EM | Admit: 2018-09-20 | Discharge: 2018-09-20 | Disposition: A | Discharge status: Home / Self Care | Payer: Self-pay | Attending: Emergency Medicine | Admitting: Emergency Medicine

## 2018-09-20 ENCOUNTER — Encounter (HOSPITAL_COMMUNITY): Payer: Self-pay | Admitting: Emergency Medicine

## 2018-09-20 DIAGNOSIS — R309 Painful micturition, unspecified: Secondary | ICD-10-CM | POA: Insufficient documentation

## 2018-09-20 DIAGNOSIS — F17210 Nicotine dependence, cigarettes, uncomplicated: Secondary | ICD-10-CM | POA: Insufficient documentation

## 2018-09-20 DIAGNOSIS — A64 Unspecified sexually transmitted disease: Secondary | ICD-10-CM | POA: Insufficient documentation

## 2018-09-20 DIAGNOSIS — R369 Urethral discharge, unspecified: Secondary | ICD-10-CM | POA: Insufficient documentation

## 2018-09-20 DIAGNOSIS — J020 Streptococcal pharyngitis: Secondary | ICD-10-CM | POA: Insufficient documentation

## 2018-09-20 LAB — URINALYSIS, ROUTINE W REFLEX MICROSCOPIC
Bilirubin Urine: NEGATIVE
GLUCOSE, UA: NEGATIVE mg/dL
Hgb urine dipstick: NEGATIVE
KETONES UR: NEGATIVE mg/dL
NITRITE: NEGATIVE
PROTEIN: NEGATIVE mg/dL
Specific Gravity, Urine: 1.015 (ref 1.005–1.030)
WBC, UA: >50 WBC/hpf — ABNORMAL HIGH (ref 0–5)
pH: 6 (ref 5.0–8.0)

## 2018-09-20 LAB — GROUP A STREP BY PCR: GROUP A STREP BY PCR: DETECTED — AB

## 2018-09-20 MED ORDER — LIDOCAINE HCL (PF) 1 % IJ SOLN
INTRAMUSCULAR | Status: AC
  Administered 2018-09-20: 1 mL
  Filled 2018-09-20: qty 5

## 2018-09-20 MED ORDER — CEFTRIAXONE SODIUM 250 MG IJ SOLR
250.0000 mg | Freq: Once | INTRAMUSCULAR | Status: AC
  Administered 2018-09-20: 250 mg via INTRAMUSCULAR
  Filled 2018-09-20: qty 250

## 2018-09-20 MED ORDER — AZITHROMYCIN 250 MG PO TABS
1000.0000 mg | ORAL_TABLET | Freq: Once | ORAL | Status: AC
  Administered 2018-09-20: 1000 mg via ORAL
  Filled 2018-09-20: qty 4

## 2018-09-20 MED ORDER — PENICILLIN G BENZATHINE 1200000 UNIT/2ML IM SUSP
1.2000 10*6.[IU] | Freq: Once | INTRAMUSCULAR | Status: AC
Start: 2018-09-20 — End: 2018-09-20
  Administered 2018-09-20: 1.2 10*6.[IU] via INTRAMUSCULAR
  Filled 2018-09-20: qty 2

## 2018-09-20 MED ORDER — METRONIDAZOLE 500 MG PO TABS
2000.0000 mg | ORAL_TABLET | Freq: Once | ORAL | Status: AC
  Administered 2018-09-20: 2000 mg via ORAL
  Filled 2018-09-20: qty 4

## 2018-09-20 NOTE — ED Notes (Signed)
Urine culture tube sent to lab to hold

## 2018-09-20 NOTE — Discharge Instructions (Signed)
No sex for one week

## 2018-09-20 NOTE — ED Triage Notes (Addendum)
Pt reports sore throat for several days, reports he had "pus pockets," resolved now. Pt reports possible STD as well, states he's got "male problems." Reports some dysuria.

## 2018-09-20 NOTE — ED Provider Notes (Signed)
[facility] EMERGENCY DEPARTMENT Provider Note   CSN: [id number] Arrival date & time: 09/20/18  0345     History   Chief Complaint Chief Complaint  Patient presents with  . Sore Throat  . SEXUALLY TRANSMITTED DISEASE    HPI Colton Price is a 37 y.o. male.  Patient presents to the emergency department with 2 complaints.  Patient reports that he has been experiencing a sore throat for several days.  He reports that initially his tonsils were swollen and had white patches, the patches have resolved.  He continues to have sore throat that worsens when he swallows.  Patient also concerned about significant urethral discharge.  He reports that his partner called him and told him that he needed to get treated for infection.  He has burning when he urinates as well as discharge.  No external lesions.  No testicular pain or swelling.     Past Medical History:  Diagnosis Date  . GERD (gastroesophageal reflux disease)   . Spontaneous pneumothorax     Patient Active Problem List   Diagnosis Date Noted  . Pneumothorax 10/13/2014    Past Surgical History:  Procedure Laterality Date  . MANDIBLE FRACTURE SURGERY          Home Medications    Prior to Admission medications   Medication Sig Start Date End Date Taking? Authorizing Provider  naproxen (NAPROSYN) 500 MG tablet Take 1 tablet (500 mg total) by mouth 2 (two) times daily. Patient not taking: Reported on 09/20/2018 07/17/16   Horton, Barbette Hair, MD    Family History History reviewed. No pertinent family history.  Social History Social History   Tobacco Use  . Smoking status: Current Some Day Smoker    Types: Cigarettes  Substance Use Topics  . Alcohol use: Yes    Comment: occasional  . Drug use: No     Allergies   Fish allergy and Fruit & vegetable daily [nutritional supplements]   Review of Systems Review of Systems  HENT: Positive for sore throat.   Genitourinary: Positive  for discharge and dysuria.  All other systems reviewed and are negative.    Physical Exam Updated Vital Signs BP (!) 132/92 (BP Location: Right Arm)   Pulse 73   Temp 98.6 F (37 C) (Oral)   Resp 16   Wt 70 kg   SpO2 100%   BMI 19.81 kg/m   Physical Exam  Constitutional: He is oriented to person, place, and time. He appears well-developed and well-nourished. No distress.  HENT:  Head: Normocephalic and atraumatic.  Right Ear: Hearing normal.  Left Ear: Hearing normal.  Nose: Nose normal.  Mouth/Throat: Mucous membranes are normal. Posterior oropharyngeal erythema present.  Eyes: Pupils are equal, round, and reactive to light. Conjunctivae and EOM are normal.  Neck: Normal range of motion. Neck supple.  Cardiovascular: Regular rhythm, S1 normal and S2 normal. Exam reveals no gallop and no friction rub.  No murmur heard. Pulmonary/Chest: Effort normal and breath sounds normal. No respiratory distress. He exhibits no tenderness.  Abdominal: Soft. Normal appearance and bowel sounds are normal. There is no hepatosplenomegaly. There is no tenderness. There is no rebound, no guarding, no tenderness at McBurney's point and negative Murphy's sign. No hernia.  Genitourinary: Testes normal. Discharge found.  Musculoskeletal: Normal range of motion.  Neurological: He is alert and oriented to person, place, and time. He has normal strength. No cranial nerve deficit or sensory deficit. Coordination normal. GCS eye subscore is 4.  GCS verbal subscore is 5. GCS motor subscore is 6.  Skin: Skin is warm, dry and intact. No rash noted. No cyanosis.  Psychiatric: He has a normal mood and affect. His speech is normal and behavior is normal. Thought content normal.  Nursing note and vitals reviewed.    ED Treatments / Results  Labs (all labs ordered are listed, but only abnormal results are displayed) Labs Reviewed  GROUP A STREP BY PCR - Abnormal; Notable for the following components:       Result Value   Group A Strep by PCR DETECTED (*)    All other components within normal limits  URINALYSIS, ROUTINE W REFLEX MICROSCOPIC - Abnormal; Notable for the following components:   APPearance CLOUDY (*)    Leukocytes, UA LARGE (*)    WBC, UA >50 (*)    Bacteria, UA RARE (*)    All other components within normal limits    EKG None  Radiology No results found.  Procedures Procedures (including critical care time)  Medications Ordered in ED Medications  penicillin g benzathine (BICILLIN LA) 1200000 UNIT/2ML injection 1.2 Million Units (has no administration in time range)  cefTRIAXone (ROCEPHIN) injection 250 mg (has no administration in time range)  azithromycin (ZITHROMAX) tablet 1,000 mg (has no administration in time range)  metroNIDAZOLE (FLAGYL) tablet 2,000 mg (has no administration in time range)     Initial Impression / Assessment and Plan / ED Course  I have reviewed the triage vital signs and the nursing notes.  Pertinent labs & imaging results that were available during my care of the patient were reviewed by me and considered in my medical decision making (see chart for details).     Patient presents to the emergency department with complaints of sore throat and urethral discharge.  Patient reports several days of tonsillar swelling and white patches.  His test was positive.  Patient also complaining of urethral discharge.  Examination reveals significant thick green discharge without external lesions.  Testicular exam normal.  Final Clinical Impressions(s) / ED Diagnoses   Final diagnoses:  Strep throat  STD (male)    ED Discharge Orders    None       Cru Kritikos, Gwenyth Allegra, MD 09/20/18 [phone]

## 2019-01-06 ENCOUNTER — Emergency Department (HOSPITAL_COMMUNITY)
Admission: EM | Admit: 2019-01-06 | Discharge: 2019-01-06 | Disposition: A | Discharge status: Home / Self Care | Payer: Self-pay | Attending: Emergency Medicine | Admitting: Emergency Medicine

## 2019-01-06 ENCOUNTER — Emergency Department (HOSPITAL_COMMUNITY): Payer: Self-pay

## 2019-01-06 ENCOUNTER — Encounter (HOSPITAL_COMMUNITY): Payer: Self-pay

## 2019-01-06 ENCOUNTER — Other Ambulatory Visit: Payer: Self-pay

## 2019-01-06 DIAGNOSIS — Y9389 Activity, other specified: Secondary | ICD-10-CM | POA: Insufficient documentation

## 2019-01-06 DIAGNOSIS — Y998 Other external cause status: Secondary | ICD-10-CM | POA: Insufficient documentation

## 2019-01-06 DIAGNOSIS — W228XXA Striking against or struck by other objects, initial encounter: Secondary | ICD-10-CM | POA: Insufficient documentation

## 2019-01-06 DIAGNOSIS — S62639A Displaced fracture of distal phalanx of unspecified finger, initial encounter for closed fracture: Secondary | ICD-10-CM

## 2019-01-06 DIAGNOSIS — F17210 Nicotine dependence, cigarettes, uncomplicated: Secondary | ICD-10-CM | POA: Insufficient documentation

## 2019-01-06 DIAGNOSIS — S62667A Nondisplaced fracture of distal phalanx of left little finger, initial encounter for closed fracture: Secondary | ICD-10-CM | POA: Insufficient documentation

## 2019-01-06 DIAGNOSIS — Y929 Unspecified place or not applicable: Secondary | ICD-10-CM | POA: Insufficient documentation

## 2019-01-06 MED ORDER — IBUPROFEN 800 MG PO TABS
800.0000 mg | ORAL_TABLET | Freq: Once | ORAL | Status: AC
  Administered 2019-01-06: 800 mg via ORAL
  Filled 2019-01-06: qty 1

## 2019-01-06 NOTE — ED Notes (Signed)
Bed: WTR5 Expected date:  Expected time:  Means of arrival:  Comments: 

## 2019-01-06 NOTE — ED Triage Notes (Addendum)
Pt brought in in [employer] custody. Complaining of L pinky finger pain. States that little finger was stepped on. Bruising noted under fingernail. Full ROM.

## 2019-01-06 NOTE — Discharge Instructions (Addendum)
Wear static splint for now. Will need follow-up to make sure finger heals appropriately.  Dr. Lenon Curt is hand specialist on call.  Can call his office to make appt. Return here for any new/acute changes.

## 2019-01-06 NOTE — ED Provider Notes (Signed)
[facility] Provider Note   CSN: [id number] Arrival date & time: 01/06/19  2226     History   Chief Complaint Chief Complaint  Patient presents with  . Finger Injury    HPI Colton Price is a 38 y.o. male.  The history is provided by the patient and medical records.     38 y.o. M with hx of GERD, presenting to the ED for left 5th digit pain.  He is under [employer] custody, states his finger got stepped on while being placed under arrest.  He states swelling and bruising to affected finger.  He is right hand dominant.  No other injuries reported.  Past Medical History:  Diagnosis Date  . GERD (gastroesophageal reflux disease)   . Spontaneous pneumothorax     Patient Active Problem List   Diagnosis Date Noted  . Pneumothorax 10/13/2014    Past Surgical History:  Procedure Laterality Date  . MANDIBLE FRACTURE SURGERY          Home Medications    Prior to Admission medications   Medication Sig Start Date End Date Taking? Authorizing Provider  naproxen (NAPROSYN) 500 MG tablet Take 1 tablet (500 mg total) by mouth 2 (two) times daily. Patient not taking: Reported on 09/20/2018 07/17/16   Horton, Barbette Hair, MD    Family History History reviewed. No pertinent family history.  Social History Social History   Tobacco Use  . Smoking status: Current Some Day Smoker    Types: Cigarettes  Substance Use Topics  . Alcohol use: Yes    Comment: occasional  . Drug use: No     Allergies   Fish allergy and Fruit & vegetable daily [nutritional supplements]   Review of Systems Review of Systems  Musculoskeletal: Positive for arthralgias.  All other systems reviewed and are negative.    Physical Exam Updated Vital Signs BP (!) 144/105 (BP Location: Right Arm)   Pulse 81   Temp 97.9 F (36.6 C) (Oral)   Resp 16   Ht 6\' 2"  (1.88 m)   Wt 72.6 kg   SpO2 99%   BMI 20.54 kg/m   Physical Exam Vitals signs and nursing  note reviewed.  Constitutional:      Appearance: He is well-developed.  HENT:     Head: Normocephalic and atraumatic.  Eyes:     Conjunctiva/sclera: Conjunctivae normal.     Pupils: Pupils are equal, round, and reactive to light.  Neck:     Musculoskeletal: Normal range of motion.  Cardiovascular:     Rate and Rhythm: Normal rate and regular rhythm.     Heart sounds: Normal heart sounds.  Pulmonary:     Effort: Pulmonary effort is normal.     Breath sounds: Normal breath sounds.  Abdominal:     General: Bowel sounds are normal.     Palpations: Abdomen is soft.  Musculoskeletal: Normal range of motion.     Comments: Distal tip of left 5th digit is bruised and swollen without acute deformity; is able to flex and extend at MCP and PIP joint but not DIP joint; small amount of subungual hematoma noted; normal cap refill and distal sensation  Skin:    General: Skin is warm and dry.  Neurological:     Mental Status: He is alert and oriented to person, place, and time.      ED Treatments / Results  Labs (all labs ordered are listed, but only abnormal results are displayed) Labs Reviewed -  No data to display  EKG None  Radiology Dg Finger Little Left  Result Date: 01/06/2019 CLINICAL DATA:  Left fifth finger with stepped on with bruising at the nail bed. EXAM: LEFT LITTLE FINGER 2+V COMPARISON:  None. FINDINGS: Acute, closed, tuft fracture of the left fifth digit without displacement. No joint dislocation. Mild soft tissue swelling is noted. IMPRESSION: Acute, closed, tuft fracture of the left fifth digit. Electronically Signed   By: Ashley Royalty M.D.   On: 01/06/2019 23:01    Procedures Procedures (including critical care time)  Medications Ordered in ED Medications - No data to display   Initial Impression / Assessment and Plan / ED Course  I have reviewed the triage vital signs and the nursing notes.  Pertinent labs & imaging results that were available during my care of  the patient were reviewed by me and considered in my medical decision making (see chart for details).  38 year old male here with left fifth digit pain.  He is in police custody, states his finger accidentally got stepped on while being placed under arrest.  Has swelling and bruising to the distal tip of left fifth digit.  X-ray confirms distal tuft fracture without displacement.  He was placed in static splint.  We will follow-up with hand surgery to ensure proper healing.  Return here for any new or acute changes.  Final Clinical Impressions(s) / ED Diagnoses   Final diagnoses:  Closed fracture of tuft of distal phalanx of finger    ED Discharge Orders    None       Larene Pickett, PA-C 01/06/19 2314    Veryl Speak, MD 01/06/19 2318

## 2019-01-06 NOTE — ED Notes (Signed)
Refused signature

## 2019-03-14 ENCOUNTER — Emergency Department (HOSPITAL_COMMUNITY)
Admission: EM | Admit: 2019-03-14 | Discharge: 2019-03-14 | Disposition: A | Discharge status: Home / Self Care | Payer: Self-pay | Attending: Emergency Medicine | Admitting: Emergency Medicine

## 2019-03-14 ENCOUNTER — Encounter (HOSPITAL_COMMUNITY): Payer: Self-pay

## 2019-03-14 ENCOUNTER — Emergency Department (HOSPITAL_COMMUNITY): Payer: Self-pay

## 2019-03-14 ENCOUNTER — Other Ambulatory Visit: Payer: Self-pay

## 2019-03-14 DIAGNOSIS — K219 Gastro-esophageal reflux disease without esophagitis: Secondary | ICD-10-CM

## 2019-03-14 DIAGNOSIS — F17210 Nicotine dependence, cigarettes, uncomplicated: Secondary | ICD-10-CM | POA: Insufficient documentation

## 2019-03-14 LAB — CBC
HCT: 54.3 % — ABNORMAL HIGH (ref 39.0–52.0)
Hemoglobin: 17.6 g/dL — ABNORMAL HIGH (ref 13.0–17.0)
MCH: 28.2 pg (ref 26.0–34.0)
MCHC: 32.4 g/dL (ref 30.0–36.0)
MCV: 86.9 fL (ref 80.0–100.0)
Platelets: 242 10*3/uL (ref 150–400)
RBC: 6.25 MIL/uL — AB (ref 4.22–5.81)
RDW: 13 % (ref 11.5–15.5)
WBC: 9.4 10*3/uL (ref 4.0–10.5)
nRBC: 0 % (ref 0.0–0.2)

## 2019-03-14 LAB — TROPONIN I
Troponin I: <0.03 ng/mL (ref <0.03)
Troponin I: <0.03 ng/mL (ref <0.03)

## 2019-03-14 LAB — BASIC METABOLIC PANEL
Anion gap: 10 (ref 5–15)
BUN: 12 mg/dL (ref 6–20)
CHLORIDE: 102 mmol/L (ref 98–111)
CO2: 26 mmol/L (ref 22–32)
Calcium: 9.9 mg/dL (ref 8.9–10.3)
Creatinine, Ser: 1.17 mg/dL (ref 0.61–1.24)
GFR calc Af Amer: >60 mL/min (ref >60)
GFR calc non Af Amer: >60 mL/min (ref >60)
Glucose, Bld: 87 mg/dL (ref 70–99)
Potassium: 3.4 mmol/L — ABNORMAL LOW (ref 3.5–5.1)
Sodium: 138 mmol/L (ref 135–145)

## 2019-03-14 MED ORDER — SODIUM CHLORIDE 0.9% FLUSH: 3.0000 mL | Freq: Once | INTRAVENOUS | Status: DC

## 2019-03-14 MED ORDER — PANTOPRAZOLE SODIUM 40 MG PO TBEC
40.0000 mg | DELAYED_RELEASE_TABLET | Freq: Once | ORAL | Status: AC
  Administered 2019-03-14: 40 mg via ORAL
  Filled 2019-03-14: qty 1

## 2019-03-14 MED ORDER — PANTOPRAZOLE SODIUM 20 MG PO TBEC: 20.0000 mg | DELAYED_RELEASE_TABLET | Freq: Every day | ORAL | 0 refills | Status: DC

## 2019-03-14 MED ORDER — ALUM & MAG HYDROXIDE-SIMETH 200-200-20 MG/5ML PO SUSP
30.0000 mL | Freq: Once | ORAL | Status: AC
  Administered 2019-03-14: 30 mL via ORAL
  Filled 2019-03-14: qty 30

## 2019-03-14 MED ORDER — CYCLOBENZAPRINE HCL 10 MG PO TABS: 10.0000 mg | ORAL_TABLET | Freq: Two times a day (BID) | ORAL | 0 refills | Status: DC | PRN

## 2019-03-14 NOTE — ED Provider Notes (Signed)
[facility] DEPT Provider Note   CSN: [id number] Arrival date & time: 03/14/19  1515    History   Chief Complaint Chief Complaint  Patient presents with  . Chest Pain    HPI Davidjames A Mitchell-Goins is a 38 y.o. male.  HPI: A 38 year old patient presents for evaluation of chest pain. Initial onset of pain was more than 6 hours ago. The patient's chest pain is described as heaviness/pressure/tightness and is not worse with exertion. The patient's chest pain is middle- or left-sided, is not well-localized, is not sharp and does not radiate to the arms/jaw/neck. The patient does not complain of nausea and denies diaphoresis. The patient has smoked in the past 90 days. The patient has no history of stroke, has no history of peripheral artery disease, denies any history of treated diabetes, has no relevant family history of coronary artery disease (first degree relative at less than age 9), is not hypertensive, has no history of hypercholesterolemia and does not have an elevated BMI (>=30).    Pt presents today stating substernal cp that began 3 days ago.   Pt states he was sitting at home when the pain began with gradual onset.   He describes the pain as substernal pressure, radiating into his back and worsened by taking deep breaths, alleviated by rest.  He is unsure of exertional component, but does report that he is shob.  He denies fever, ENT symptoms, cough/congestion, abd pain, n/v/d.   States he has PMH of spontaneous pneumothorax 5 years ago that did require a chest tube and 4 day admission.   Currently, pt is speaking in full sentences and does not currently appear to be in distress.  He is a heavy smoker.   He otherwise has no PMH/MED/SBI.    The history is provided by the patient.    Past Medical History:  Diagnosis Date  . GERD (gastroesophageal reflux disease)   . Spontaneous pneumothorax     Patient Active Problem List   Diagnosis Date Noted  .  Pneumothorax 10/13/2014    Past Surgical History:  Procedure Laterality Date  . MANDIBLE FRACTURE SURGERY          Home Medications    Prior to Admission medications   Medication Sig Start Date End Date Taking? Authorizing Provider  naproxen (NAPROSYN) 500 MG tablet Take 1 tablet (500 mg total) by mouth 2 (two) times daily. Patient not taking: Reported on 09/20/2018 07/17/16   Horton, Barbette Hair, MD    Family History Family History  Problem Relation Age of Onset  . Thyroid disease Mother     Social History Social History   Tobacco Use  . Smoking status: Current Some Day Smoker    Types: Cigarettes  . Smokeless tobacco: Never Used  Substance Use Topics  . Alcohol use: Yes    Comment: occasional  . Drug use: No     Allergies   Fish allergy and Fruit & vegetable daily [nutritional supplements]   Review of Systems Review of Systems  Constitutional: Negative for chills and fever.  HENT: Negative for congestion, ear pain, rhinorrhea, sneezing and sore throat.   Respiratory: Positive for chest tightness and shortness of breath.   Cardiovascular: Positive for chest pain.  Gastrointestinal: Negative for abdominal pain, diarrhea, nausea and vomiting.  Musculoskeletal: Positive for back pain.  Skin: Negative for color change.     Physical Exam Updated Vital Signs BP 128/86   Pulse 66   Temp 98 F (36.7  C) (Oral)   Resp 19   Ht 6\' 2"  (1.88 m)   Wt 72.6 kg   SpO2 99%   BMI 20.54 kg/m   Physical Exam Constitutional:      General: He is not in acute distress.    Appearance: He is well-developed and normal weight. He is not toxic-appearing or diaphoretic.  HENT:     Head: Normocephalic.  Neck:     Musculoskeletal: Neck supple.  Cardiovascular:     Rate and Rhythm: Normal rate and regular rhythm.     Heart sounds: Normal heart sounds. Heart sounds not distant. No murmur.  Pulmonary:     Effort: Pulmonary effort is normal. No tachypnea, accessory muscle  usage or respiratory distress.     Breath sounds: Normal breath sounds. No decreased breath sounds, wheezing, rhonchi or rales.  Chest:     Chest wall: No mass, tenderness or crepitus.  Abdominal:     Palpations: Abdomen is soft.     Tenderness: There is no abdominal tenderness.  Musculoskeletal:     Right lower leg: No edema.     Left lower leg: No edema.  Skin:    General: Skin is warm.  Neurological:     Mental Status: He is alert and oriented to person, place, and time.  Psychiatric:        Mood and Affect: Mood normal.        Behavior: Behavior normal.      ED Treatments / Results  Labs (all labs ordered are listed, but only abnormal results are displayed) Labs Reviewed  BASIC METABOLIC PANEL - Abnormal; Notable for the following components:      Result Value   Potassium 3.4 (*)    All other components within normal limits  CBC - Abnormal; Notable for the following components:   RBC 6.25 (*)    Hemoglobin 17.6 (*)    HCT 54.3 (*)    All other components within normal limits  TROPONIN I  TROPONIN I    EKG EKG Interpretation  Date/Time:  Friday March 14 2019 15:25:05 EDT Ventricular Rate:  77 PR Interval:    QRS Duration: 89 QT Interval:  372 QTC Calculation: 421 R Axis:   85 Text Interpretation:  Sinus rhythm No STEMI.  Confirmed by Nanda Quinton [phone]) on 03/14/2019 3:27:30 PM   Radiology Dg Chest 2 View  Result Date: 03/14/2019 CLINICAL DATA:  Chest pain. EXAM: CHEST - 2 VIEW COMPARISON:  Radiographs of July 17, 2016. FINDINGS: The heart size and mediastinal contours are within normal limits. Both lungs are clear. No pneumothorax or pleural effusion is noted. The visualized skeletal structures are unremarkable. IMPRESSION: No active cardiopulmonary disease. Electronically Signed   By: Marijo Conception, M.D.   On: 03/14/2019 16:33    Procedures Procedures (including critical care time)  Medications Ordered in ED Medications  sodium chloride flush (NS)  0.9 % injection 3 mL (has no administration in time range)  alum & mag hydroxide-simeth (MAALOX/MYLANTA) 200-200-20 MG/5ML suspension 30 mL (30 mLs Oral Given 03/14/19 1705)  pantoprazole (PROTONIX) EC tablet 40 mg (40 mg Oral Given 03/14/19 1705)     Initial Impression / Assessment and Plan / ED Course  I have reviewed the triage vital signs and the nursing notes.  Pertinent labs & imaging results that were available during my care of the patient were reviewed by me and considered in my medical decision making (see chart for details).     Vitals:  03/14/19 1640 03/14/19 1809  BP: (!) 147/91 128/86  Pulse: 69 66  Resp: 17 19  Temp:    SpO2: 100% 99%      RE-EVALUATION 1800:   After GI cocktail and protonix, pt is now cp free.   He states he is still having mild upper back pain, but he attributes this to "the way I'm laying in this bed".    He continues to appear well and vitals have remained stable/unremarkable during his stay.   Will repeat second trop and if negative, will send pt home with medications for GERD and follow up with PCP outpatient.   He has been given follow up and return precautions.   He understands and is agreeable to plan.   Pt was also seen/evaluated by Dr. Zenia Resides.   Case was discussed with Dr. Zenia Resides including HPI/ROS/PE, LABS, IMAGING.   HEAR Score: 2  RE-EVALUATION:  1909:   Pt continues to remain cp free.   Second troponin negative.   Repeat exam unremarkable and vitals re-assuring.   Pt has been counseled on bland diet for 2-3 days and to follow up with PCP in 1-2 weeks.   Return precautions have been provided.    Final Clinical Impressions(s) / ED Diagnoses   Final diagnoses:  None    ED Discharge Orders    None       Patirica Longshore, Imagene Riches, PA-C 03/14/19 1917    Lacretia Leigh, MD 03/15/19 1535

## 2019-03-14 NOTE — ED Provider Notes (Signed)
Medical screening examination/treatment/procedure(s) were conducted as a shared visit with non-physician practitioner(s) and myself.  I personally evaluated the patient during the encounter.  EKG Interpretation  Date/Time:  Friday March 14 2019 15:25:05 EDT Ventricular Rate:  77 PR Interval:    QRS Duration: 89 QT Interval:  372 QTC Calculation: 421 R Axis:   85 Text Interpretation:  Sinus rhythm No STEMI.  Confirmed by Nanda Quinton [phone]) on 03/14/2019 3:60:12 PM 38 year old male presents with right-sided scapular pain that went to his chest.  Pain is worse with movement and better with remaining still.  Heart score of 2 and troponin negative x2.  Patient stable for discharge   Lacretia Leigh, MD 03/14/19 [phone]

## 2019-03-14 NOTE — ED Triage Notes (Signed)
Patient c/o mid and right chest pain that radiates into the upper back x 2-3 days. Patient reports that he has history of pneumothorax. Patient also c/o intermittent SOB.

## 2019-03-14 NOTE — Discharge Instructions (Addendum)
Bland diet for 2-3 days.   Please follow up with PCP in 1-2 weeks.   Return to ED sooner if needed.

## 2020-04-05 ENCOUNTER — Ambulatory Visit (HOSPITAL_COMMUNITY)
Admission: EM | Admit: 2020-04-05 | Discharge: 2020-04-05 | Disposition: A | Discharge status: Home / Self Care | Payer: Self-pay | Attending: Family Medicine | Admitting: Family Medicine

## 2020-04-05 ENCOUNTER — Encounter (HOSPITAL_COMMUNITY): Payer: Self-pay

## 2020-04-05 ENCOUNTER — Other Ambulatory Visit: Payer: Self-pay

## 2020-04-05 DIAGNOSIS — R002 Palpitations: Secondary | ICD-10-CM

## 2020-04-05 DIAGNOSIS — R000 Tachycardia, unspecified: Secondary | ICD-10-CM

## 2020-04-05 HISTORY — DX: Pneumothorax, unspecified: J93.9

## 2020-04-05 NOTE — ED Provider Notes (Signed)
[facility]    CSN: [id number] Arrival date & time: 04/05/20  1159      History   Chief Complaint Chief Complaint  Patient presents with  . Dizziness    HPI Colton Price is a 39 y.o. male.   HPI  Patient states that he goes to the gym every day.  He is currently not working.  He states while at the gym today he was exercising.  He had a sudden episode of shortness of breath and tachycardia.  He states that his heart started fluttering.  He felt winded.  He felt lightheaded.  He needed to sit down.  He was performing "dead lifts".  He did not have this trouble earlier while he was on the treadmill.  He states that the pain went away when he rested.  He has not had any problems since then.  Mother has thyroid disease.  He has never had any. Drinks caffeine in moderation Denies illicit drugs Denies any over-the-counter products Is on no medications No history of hypertension, heart disease, diabetes, heart disease in family members Positive cigarette smoking in the past, none current He had a spontaneous pneumothorax in 2015.  States he has periodic shortness of breath since then.  Most the time he breathes normally.  No underlying lung disease or COPD or asthma known  Past Medical History:  Diagnosis Date  . GERD (gastroesophageal reflux disease)   . Pneumothorax   . Spontaneous pneumothorax     Patient Active Problem List   Diagnosis Date Noted  . Pneumothorax 10/13/2014    Past Surgical History:  Procedure Laterality Date  . FRACTURE SURGERY    . MANDIBLE FRACTURE SURGERY         Home Medications    Prior to Admission medications   Medication Sig Start Date End Date Taking? Authorizing Provider  cyclobenzaprine (FLEXERIL) 10 MG tablet Take 1 tablet (10 mg total) by mouth 2 (two) times daily as needed for muscle spasms. 03/14/19   Nodal, Amber K, PA-C  naproxen (NAPROSYN) 500 MG tablet Take 1 tablet (500 mg total) by mouth 2 (two) times  daily. Patient not taking: Reported on 09/20/2018 07/17/16   Horton, Barbette Hair, MD  pantoprazole (PROTONIX) 20 MG tablet Take 1 tablet (20 mg total) by mouth daily. 03/14/19   Nodal, Cecil Cobbs, PA-C    Family History Family History  Problem Relation Age of Onset  . Thyroid disease Mother     Social History Social History   Tobacco Use  . Smoking status: Former Research scientist (life sciences)  . Smokeless tobacco: Never Used  Substance Use Topics  . Alcohol use: Not Currently    Comment: occasional  . Drug use: Yes    Types: Marijuana     Allergies   Fish allergy, Fish allergy, Fruit & vegetable daily [nutritional supplements], and Other   Review of Systems Review of Systems  Cardiovascular: Positive for palpitations.  Neurological: Positive for light-headedness.  See HPI   Physical Exam Triage Vital Signs ED Triage Vitals [04/05/20 1206]  Enc Vitals Group     BP (!) 141/78     Pulse Rate 78     Resp 16     Temp 98.1 F (36.7 C)     Temp src      SpO2 98 %     Weight      Height      Head Circumference      Peak Flow      Pain  Score 0     Pain Loc      Pain Edu?      Excl. in [place]?    No data found.  Updated Vital Signs BP 118/80 (BP Location: Left Arm)   Pulse 78   Temp 98.1 F (36.7 C)   Resp 16   SpO2 98%      Physical Exam Constitutional:      General: He is not in acute distress.    Appearance: Normal appearance. He is well-developed and normal weight.     Comments: Lean and athletic in appearance  HENT:     Head: Normocephalic and atraumatic.     Mouth/Throat:     Comments: Mask is in place Eyes:     Conjunctiva/sclera: Conjunctivae normal.     Pupils: Pupils are equal, round, and reactive to light.  Cardiovascular:     Rate and Rhythm: Normal rate and regular rhythm.     Heart sounds: Normal heart sounds. No murmur.  Pulmonary:     Effort: Pulmonary effort is normal. No respiratory distress.     Breath sounds: Normal breath sounds.  Abdominal:     General:  There is no distension.     Palpations: Abdomen is soft.     Tenderness: There is no abdominal tenderness.     Comments: No mass or organomegaly  Musculoskeletal:        General: Normal range of motion.     Cervical back: Normal range of motion and neck supple.     Right lower leg: No edema.     Left lower leg: No edema.  Skin:    General: Skin is warm and dry.  Neurological:     Mental Status: He is alert.  Psychiatric:        Mood and Affect: Mood normal.        Behavior: Behavior normal.      UC Treatments / Results  Labs (all labs ordered are listed, but only abnormal results are displayed) Labs Reviewed - No data to display  EKG   Radiology No results found.  Procedures Procedures (including critical care time)  Medications Ordered in UC Medications - No data to display  Initial Impression / Assessment and Plan / UC Course  I have reviewed the triage vital signs and the nursing notes.  Pertinent labs & imaging results that were available during my care of the patient were reviewed by me and considered in my medical decision making (see chart for details).     Examination is normal.  EKG is normal.  Normal rate and rhythm, intervals, no ST or T wave changes.  I discussed with patient that palpitations that cause lightheadedness during exercise are worrisome.  He needs to have a cardiology evaluation.  Likely will need a treadmill evaluation.  Referral is given Final Clinical Impressions(s) / UC Diagnoses   Final diagnoses:  Exercise-induced tachycardia     Discharge Instructions     Need to follow up with cardiology PRIOR to additional exercise Limit caffiene   ED Prescriptions    None     PDMP not reviewed this encounter.   Raylene Everts, MD 04/05/20 [phone]

## 2020-04-05 NOTE — Discharge Instructions (Addendum)
Need to follow up with cardiology PRIOR to additional exercise Limit caffiene

## 2020-04-05 NOTE — ED Triage Notes (Signed)
Pt c/o sudden onset shortness of breath, light-headedness and heart "fluttering" while working out. Pt denies those symptoms at the present, only feels fatigued.

## 2020-04-28 ENCOUNTER — Other Ambulatory Visit: Payer: Self-pay

## 2020-04-28 ENCOUNTER — Ambulatory Visit (HOSPITAL_COMMUNITY)
Admission: EM | Admit: 2020-04-28 | Discharge: 2020-04-28 | Disposition: A | Discharge status: Home / Self Care | Payer: HRSA Program | Attending: Emergency Medicine | Admitting: Emergency Medicine

## 2020-04-28 ENCOUNTER — Encounter (HOSPITAL_COMMUNITY): Payer: Self-pay

## 2020-04-28 ENCOUNTER — Ambulatory Visit (INDEPENDENT_AMBULATORY_CARE_PROVIDER_SITE_OTHER): Payer: HRSA Program

## 2020-04-28 DIAGNOSIS — R0602 Shortness of breath: Secondary | ICD-10-CM

## 2020-04-28 DIAGNOSIS — U071 COVID-19: Secondary | ICD-10-CM | POA: Diagnosis not present

## 2020-04-28 DIAGNOSIS — J069 Acute upper respiratory infection, unspecified: Secondary | ICD-10-CM | POA: Diagnosis present

## 2020-04-28 DIAGNOSIS — R079 Chest pain, unspecified: Secondary | ICD-10-CM

## 2020-04-28 DIAGNOSIS — Z20822 Contact with and (suspected) exposure to covid-19: Secondary | ICD-10-CM | POA: Insufficient documentation

## 2020-04-28 MED ORDER — IBUPROFEN 600 MG PO TABS: 600.0000 mg | ORAL_TABLET | Freq: Four times a day (QID) | ORAL | 0 refills | Status: AC | PRN

## 2020-04-28 MED ORDER — ALBUTEROL SULFATE HFA 108 (90 BASE) MCG/ACT IN AERS: 1.0000 | INHALATION_SPRAY | Freq: Four times a day (QID) | RESPIRATORY_TRACT | 0 refills | Status: AC | PRN

## 2020-04-28 MED ORDER — AEROCHAMBER PLUS MISC: 2 refills | Status: AC

## 2020-04-28 MED ORDER — BENZONATATE 200 MG PO CAPS: 200.0000 mg | ORAL_CAPSULE | Freq: Three times a day (TID) | ORAL | 0 refills | Status: DC | PRN

## 2020-04-28 NOTE — ED Triage Notes (Addendum)
Patient complains of cough, fatigue, no appetite and headache  x 4 days

## 2020-04-28 NOTE — ED Provider Notes (Signed)
HPI  SUBJECTIVE:  Colton Price is a 39 y.o. male who presents with headaches, decreased appetite starting 4 days ago.  he reports sore throat from the cough, cough productive of white phlegm, shortness of breath, wheezing.  he reports chest pain described as tightness and soreness secondary to the cough, nausea and abdominal cramping.  No body aches, fevers, nasal congestion, loss of smell or taste.  No vomiting, diarrhea.  No known Covid exposure.  he has not yet gotten a Covid vaccine.  No antibiotics in the past month.  No antipyretic in the past 4 to 6 hours.  he has tried allergy medication without improvement in his symptoms.  Symptoms are worse with standing/exertion.  States that he is sleeping through the night without waking up coughing.  he is not using his nebulizer medication.  He has a past medical history of spontaneous pneumothorax left side status post chest tube.  No history of pulmonary disease, smoking, diabetes, hypertension, coronary disease, chronic kidney disease, HIV, cancer, immunocompromise.  He continues to smoke.  PMD: None.   Past Medical History:  Diagnosis Date  . GERD (gastroesophageal reflux disease)   . Pneumothorax   . Spontaneous pneumothorax     Past Surgical History:  Procedure Laterality Date  . FRACTURE SURGERY    . MANDIBLE FRACTURE SURGERY      Family History  Problem Relation Age of Onset  . Thyroid disease Mother     Social History   Tobacco Use  . Smoking status: Former Research scientist (life sciences)  . Smokeless tobacco: Never Used  Substance Use Topics  . Alcohol use: Not Currently    Comment: occasional  . Drug use: Yes    Types: Marijuana    No current facility-administered medications for this encounter.  Current Outpatient Medications:  .  albuterol (VENTOLIN HFA) 108 (90 Base) MCG/ACT inhaler, Inhale 1-2 puffs into the lungs every 6 (six) hours as needed for wheezing or shortness of breath., Disp: 18 g, Rfl: 0 .  benzonatate (TESSALON)  200 MG capsule, Take 1 capsule (200 mg total) by mouth 3 (three) times daily as needed for cough., Disp: 30 capsule, Rfl: 0 .  ibuprofen (ADVIL) 600 MG tablet, Take 1 tablet (600 mg total) by mouth every 6 (six) hours as needed., Disp: 30 tablet, Rfl: 0 .  Spacer/Aero-Holding Chambers (AEROCHAMBER PLUS) inhaler, Use as instructed, Disp: 1 each, Rfl: 2  Allergies  Allergen Reactions  . Fish Allergy Diarrhea, Itching and Nausea And Vomiting    All kinds of fish  . Fish Allergy Nausea And Vomiting  . Fruit & Vegetable Daily [Nutritional Supplements] Itching    Gum swelling.  . Other Itching    Fresh fruit and vegetables cause irritation in mouth and throat     ROS  As noted in HPI.   Physical Exam  BP 133/80 (BP Location: Right Arm)   Pulse 76   Temp 98.8 F (37.1 C) (Oral)   Resp 18   Ht 6\' 2"  (1.88 m)   Wt 72.6 kg   SpO2 100%   BMI 20.54 kg/m   Constitutional: Well developed, well nourished, no acute distress Eyes:  EOMI, conjunctiva normal bilaterally HENT: Normocephalic, atraumatic,mucus membranes moist.  Mild nasal congestion.  No maxillary or frontal sinus tenderness.  Normal oropharynx, normal tonsils without exudates, uvula midline.  No appreciable postnasal drip.   Respiratory: Normal inspiratory effort lungs clear bilaterally, good air movement.   Cardiovascular: Normal rate regular rhythm no murmurs rubs gallops GI: nondistended  soft nontender no rebound, guarding, active bowel sounds. skin: No rash, skin intact Musculoskeletal: no deformities Neurologic: Alert & oriented x 3, no focal neuro deficits Psychiatric: Speech and behavior appropriate   ED Course   Medications - No data to display  Orders Placed This Encounter  Procedures  . SARS CORONAVIRUS 2 (TAT 6-24 HRS) Nasopharyngeal Nasopharyngeal Swab    Standing Status:   Standing    Number of Occurrences:   1    Order Specific Question:   Is this test for diagnosis or screening    Answer:   Diagnosis  of ill patient    Order Specific Question:   Symptomatic for COVID-19 as defined by CDC    Answer:   Yes    Order Specific Question:   Date of Symptom Onset    Answer:   04/24/2020    Order Specific Question:   Hospitalized for COVID-19    Answer:   No    Order Specific Question:   Admitted to ICU for COVID-19    Answer:   No    Order Specific Question:   Previously tested for COVID-19    Answer:   No    Order Specific Question:   Resident in a congregate (group) care setting    Answer:   No    Order Specific Question:   Employed in healthcare setting    Answer:   No    Order Specific Question:   Has patient completed COVID vaccination(s) (2 doses of Pfizer/Moderna 1 dose of [employer])    Answer:   No  . DG Chest 2 View    Standing Status:   Standing    Number of Occurrences:   1    Order Specific Question:   Reason for Exam (SYMPTOM  OR DIAGNOSIS REQUIRED)    Answer:   h/o L PTX r/o pna, repeat ptx    Results for orders placed or performed during the hospital encounter of 04/28/20 (from the past 24 hour(s))  SARS CORONAVIRUS 2 (TAT 6-24 HRS) Nasopharyngeal Nasopharyngeal Swab     Status: Abnormal   Collection Time: 04/28/20  5:26 PM   Specimen: Nasopharyngeal Swab  Result Value Ref Range   SARS Coronavirus 2 POSITIVE (A) NEGATIVE   DG Chest 2 View  Result Date: 04/28/2020 CLINICAL DATA:  Chest pain and shortness of breath EXAM: CHEST - 2 VIEW COMPARISON:  03/14/2019 FINDINGS: Cardiac shadow is within normal limits. The lungs are well aerated bilaterally. No focal infiltrate or sizable effusion is seen. No acute bony abnormality is noted. IMPRESSION: No active cardiopulmonary disease. Electronically Signed   By: Inez Catalina M.D.   On: 04/28/2020 18:43    ED Clinical Impression  1. COVID-19 virus infection   2. Upper respiratory tract infection, unspecified type   3. Encounter for laboratory testing for COVID-19 virus      ED Assessment/Plan  Covid PCR sent.   Patient may be a candidate for infusion clinic if positive given history of pneumothorax.  Checking chest x-ray to rule repeat pneumothorax, pneumonia.  Plan to send home with Tessalon, ibuprofen 600 mg / 1000 mg of Tylenol, albuterol inhaler with a spacer.  He is to take 2 puffs every 4 hours for 2 days, 2 puffs every 6 hours for 2 days, then as needed thereafter.  Also providing a primary care list.  Reviewed imaging independently.  Normal chest x-ray.  See radiology report for full details.  Plan as above.  8/12 [phone]  labs reviewed.  Patient positive for Covid. Called infusion clinic.   Discussed labs, imaging, MDM, treatment plan, and plan for follow-up with patient. Discussed sn/sx that should prompt return to the ED. patient agrees with plan.   Meds ordered this encounter  Medications  . albuterol (VENTOLIN HFA) 108 (90 Base) MCG/ACT inhaler    Sig: Inhale 1-2 puffs into the lungs every 6 (six) hours as needed for wheezing or shortness of breath.    Dispense:  18 g    Refill:  0  . Spacer/Aero-Holding Chambers (AEROCHAMBER PLUS) inhaler    Sig: Use as instructed    Dispense:  1 each    Refill:  2  . benzonatate (TESSALON) 200 MG capsule    Sig: Take 1 capsule (200 mg total) by mouth 3 (three) times daily as needed for cough.    Dispense:  30 capsule    Refill:  0  . ibuprofen (ADVIL) 600 MG tablet    Sig: Take 1 tablet (600 mg total) by mouth every 6 (six) hours as needed.    Dispense:  30 tablet    Refill:  0    *This clinic note was created using Lobby[employer]. Therefore, there may be occasional mistakes despite careful proofreading.   ?    Melynda Ripple, MD 04/29/20 [phone]

## 2020-04-28 NOTE — Discharge Instructions (Addendum)
We will contact you if your Covid test comes back positive.  It will be back in 24 hours maximum.  Your chest x-ray was normal today.  2 puffs from your albuterol inhaler using your spacer every 4 hours for 2 days, then every 6 hours for 2 days.  Then you may use it as needed.  Tessalon for the cough, ibuprofen 600 mg combined with 1000 mg of Tylenol together 3 or 4 times a day as needed for body aches, headaches.  Below is a list of primary care practices who are taking new patients for you to follow-up with.  [facility] internal medicine clinic Ground Floor - [facility], [place], [place], [place] [address] [phone]  [facility] Primary Care at [facility] [address] [place], [place] [address] [phone]  [place] and [facility] [place], [place] [address] [phone]  [facility]Internal Medicine [phone] [place] [phone]  [facility]: [place]  [phone]  [place] and [facility]: [place]   [phone]  [facility] Family Medicine: [address] [place]  [phone]  [place] primary care : [address] [place] [phone]  [facility] Primary Care: [address] [place] [phone]  [facility]: [place] [phone]  Dr. Blanchie Serve [place]  [phone]  Dr. Benito Mccreedy, [facility]. [place], [place] [address]  [phone]  Go to www.goodrx.com to look up your medications. This will give you a list of where you can find your prescriptions at the most  affordable prices. Or ask the pharmacist what the cash price is, or if they have any other discount programs available to help make your medication more affordable. This can be less expensive than what you would pay with insurance.

## 2020-04-29 ENCOUNTER — Telehealth: Payer: Self-pay | Admitting: Unknown Physician Specialty

## 2020-04-29 LAB — SARS CORONAVIRUS 2 (TAT 6-24 HRS): SARS Coronavirus 2: POSITIVE — AB

## 2020-04-29 NOTE — Telephone Encounter (Signed)
Called to discuss with Kingsley A Mitchell-Goins about Covid symptoms and the use of bamlanivimab, a monoclonal antibody infusion for those with mild to moderate Covid symptoms and at a high risk of hospitalization.      Pt is not qualified for this infusion due to lack of identified risk factors and co-morbid conditions.  Symptoms reviewed as well as criteria for ending isolation.  Symptoms reviewed that would warrant ED/Hospital evaluation as well should her condition worsen. Preventative practices reviewed. Patient verbalized understanding.     Patient Active Problem List   Diagnosis Date Noted  . Pneumothorax 10/13/2014    Kathrine Haddock FNP

## 2021-07-18 ENCOUNTER — Ambulatory Visit (HOSPITAL_COMMUNITY)
Admission: EM | Admit: 2021-07-18 | Discharge: 2021-07-18 | Disposition: A | Discharge status: Home / Self Care | Payer: Self-pay | Attending: Internal Medicine | Admitting: Internal Medicine

## 2021-07-18 ENCOUNTER — Other Ambulatory Visit: Payer: Self-pay

## 2021-07-18 ENCOUNTER — Encounter (HOSPITAL_COMMUNITY): Payer: Self-pay

## 2021-07-18 DIAGNOSIS — R519 Headache, unspecified: Secondary | ICD-10-CM

## 2021-07-18 NOTE — ED Provider Notes (Signed)
[facility]    CSN: [id number] Arrival date & time: 07/18/21  0815      History   Chief Complaint Chief Complaint  Patient presents with   Dizziness    HPI Colton Price is a 40 y.o. male comes to the urgent care with complaints of left-sided headache which started Friday.  Patient has a history of migraine.  He describes his headache as typical of his migraines.  He has been taking some ibuprofen with improvement in the headaches.  Currently patient does not have a headache.  He denies any chest pain, chest pressure, sore throat, light sensitivity or blurry vision.  He is here mainly to have a work note.  He has no headache at this time.   HPI  Past Medical History:  Diagnosis Date   GERD (gastroesophageal reflux disease)    Pneumothorax    Spontaneous pneumothorax     Patient Active Problem List   Diagnosis Date Noted   Pneumothorax 10/13/2014    Past Surgical History:  Procedure Laterality Date   FRACTURE SURGERY     MANDIBLE FRACTURE SURGERY         Home Medications    Prior to Admission medications   Medication Sig Start Date End Date Taking? Authorizing Provider  ibuprofen (ADVIL) 600 MG tablet Take 1 tablet (600 mg total) by mouth every 6 (six) hours as needed. 04/28/20  Yes Melynda Ripple, MD  albuterol (VENTOLIN HFA) 108 (90 Base) MCG/ACT inhaler Inhale 1-2 puffs into the lungs every 6 (six) hours as needed for wheezing or shortness of breath. 04/28/20   Melynda Ripple, MD  Spacer/Aero-Holding Chambers (AEROCHAMBER PLUS) inhaler Use as instructed 04/28/20   Melynda Ripple, MD  pantoprazole (PROTONIX) 20 MG tablet Take 1 tablet (20 mg total) by mouth daily. 03/14/19 04/28/20  Nodal, Cecil Cobbs, PA-C    Family History Family History  Problem Relation Age of Onset   Thyroid disease Mother     Social History Social History   Tobacco Use   Smoking status: Former   Smokeless tobacco: Never  Scientific laboratory technician Use: Former   Substance Use Topics   Alcohol use: Not Currently    Comment: occasional   Drug use: Yes    Types: Marijuana     Allergies   Fish allergy, Fish allergy, Fruit & vegetable daily [nutritional supplements], and Other   Review of Systems Review of Systems  HENT: Negative.    Neurological: Negative.     Physical Exam Triage Vital Signs ED Triage Vitals  Enc Vitals Group     BP 07/18/21 0840 125/81     Pulse Rate 07/18/21 0840 65     Resp 07/18/21 0840 20     Temp 07/18/21 0840 97.8 F (36.6 C)     Temp Source 07/18/21 0840 Oral     SpO2 07/18/21 0840 98 %     Weight --      Height --      Head Circumference --      Peak Flow --      Pain Score 07/18/21 0838 5     Pain Loc --      Pain Edu? --      Excl. in [place]? --    No data found.  Updated Vital Signs BP 125/81 (BP Location: Right Arm)   Pulse 65   Temp 97.8 F (36.6 C) (Oral)   Resp 20   SpO2 98%   Visual Acuity Right Eye  Distance:   Left Eye Distance:   Bilateral Distance:    Right Eye Near:   Left Eye Near:    Bilateral Near:     Physical Exam Vitals and nursing note reviewed.  Constitutional:      General: He is not in acute distress.    Appearance: He is not ill-appearing.  Cardiovascular:     Rate and Rhythm: Normal rate and regular rhythm.  Abdominal:     General: Bowel sounds are normal.     Palpations: Abdomen is soft.  Musculoskeletal:        General: Normal range of motion.  Neurological:     General: No focal deficit present.     Mental Status: He is alert and oriented to person, place, and time. Mental status is at baseline.     Cranial Nerves: No cranial nerve deficit.     Sensory: No sensory deficit.     Motor: No weakness.     Coordination: Coordination normal.     Gait: Gait normal.     Deep Tendon Reflexes: Reflexes normal.     UC Treatments / Results  Labs (all labs ordered are listed, but only abnormal results are displayed) Labs Reviewed - No data to  display  EKG   Radiology No results found.  Procedures Procedures (including critical care time)  Medications Ordered in UC Medications - No data to display  Initial Impression / Assessment and Plan / UC Course  I have reviewed the triage vital signs and the nursing notes.  Pertinent labs & imaging results that were available during my care of the patient were reviewed by me and considered in my medical decision making (see chart for details).     1.  Acute non-intractable headache: Headache is currently resolved Work note given Return precautions given. Final Clinical Impressions(s) / UC Diagnoses   Final diagnoses:  Acute nonintractable headache, unspecified headache type     Discharge Instructions      Continue taking ibuprofen If you have worsening symptoms please return to the urgent care to be reevaluated.   ED Prescriptions   None    PDMP not reviewed this encounter.   Chase Picket, MD 07/18/21 [phone]

## 2021-07-18 NOTE — ED Triage Notes (Addendum)
Pt c/o lightheadedness that started on 8/30, and headache. Pt states he is lightheaded at this time, denies changes in vision. States he has SOB and tightness in chest with and without activity at times but none at this time. Pt states he does not want to have an EKG completed after educated patient on purpose and reason (provider made aware).

## 2021-07-18 NOTE — Discharge Instructions (Addendum)
Continue taking ibuprofen If you have worsening symptoms please return to the urgent care to be reevaluated.

## 2023-12-16 ENCOUNTER — Encounter (HOSPITAL_BASED_OUTPATIENT_CLINIC_OR_DEPARTMENT_OTHER): Payer: Self-pay | Admitting: Emergency Medicine

## 2023-12-16 ENCOUNTER — Other Ambulatory Visit: Payer: Self-pay

## 2023-12-16 ENCOUNTER — Emergency Department (HOSPITAL_BASED_OUTPATIENT_CLINIC_OR_DEPARTMENT_OTHER)
Admission: EM | Admit: 2023-12-16 | Discharge: 2023-12-16 | Disposition: A | Discharge status: Home / Self Care | Payer: Medicaid Other

## 2023-12-16 DIAGNOSIS — S00451A Superficial foreign body of right ear, initial encounter: Secondary | ICD-10-CM

## 2023-12-16 DIAGNOSIS — W448XXA Other foreign body entering into or through a natural orifice, initial encounter: Secondary | ICD-10-CM | POA: Insufficient documentation

## 2023-12-16 DIAGNOSIS — T161XXA Foreign body in right ear, initial encounter: Secondary | ICD-10-CM | POA: Diagnosis present

## 2023-12-16 NOTE — ED Notes (Signed)
Right ear is intact, no swelling or redness noted. Pt denies pain.

## 2023-12-16 NOTE — Discharge Instructions (Signed)
Return if any problems.

## 2023-12-16 NOTE — ED Notes (Signed)
Back of earring fell out during triage. Provider in triage to assess and discharge.

## 2023-12-16 NOTE — ED Provider Notes (Signed)
[place] EMERGENCY DEPARTMENT AT [facility] Provider Note   CSN: [id number] Arrival date & time: 12/16/23  1947     History  Chief Complaint  Patient presents with   back of earing stuck on ear    Colton Price is a 42 y.o. male.  Patient reports that he has a piece of an earring stuck on the back of his ear.  The triage nurse was able to remove.  Patient denies any other complaints he is not having any pain.  The history is provided by the patient.       Home Medications Prior to Admission medications   Medication Sig Start Date End Date Taking? Authorizing Provider  albuterol (VENTOLIN HFA) 108 (90 Base) MCG/ACT inhaler Inhale 1-2 puffs into the lungs every 6 (six) hours as needed for wheezing or shortness of breath. 04/28/20   Domenick Gong, MD  ibuprofen (ADVIL) 600 MG tablet Take 1 tablet (600 mg total) by mouth every 6 (six) hours as needed. 04/28/20   Domenick Gong, MD  Spacer/Aero-Holding Chambers (AEROCHAMBER PLUS) inhaler Use as instructed 04/28/20   Domenick Gong, MD  pantoprazole (PROTONIX) 20 MG tablet Take 1 tablet (20 mg total) by mouth daily. 03/14/19 04/28/20  Nodal, Amber K, PA-C      Allergies    Fish allergy, Fish allergy, Fruit & vegetable daily [nutritional supplements], and Other    Review of Systems   Review of Systems  All other systems reviewed and are negative.   Physical Exam Updated Vital Signs BP 124/78 (BP Location: Right Arm)   Pulse 89   Temp 97.9 F (36.6 C)   Resp 18   Ht 6\' 2"  (1.88 m)   Wt 74.8 kg   SpO2 98%   BMI 21.18 kg/m  Physical Exam Vitals reviewed.  Constitutional:      Appearance: Normal appearance.  HENT:     Right Ear: External ear normal.     Left Ear: External ear normal.  Musculoskeletal:        General: Normal range of motion.  Skin:    General: Skin is warm.  Neurological:     General: No focal deficit present.     Mental Status: He is alert.  Psychiatric:        Mood  and Affect: Mood normal.     ED Results / Procedures / Treatments   Labs (all labs ordered are listed, but only abnormal results are displayed) Labs Reviewed - No data to display  EKG None  Radiology No results found.  Procedures Procedures    Medications Ordered in ED Medications - No data to display  ED Course/ Medical Decision Making/ A&P                                 Medical Decision Making Patient reports the back of his earring was stuck in his ear the triage nurse was able to remove with an alcohol swab.  Risk Risk Details: Patient denies any other complaints he is advised to keep area clean return if any problems.           Final Clinical Impression(s) / ED Diagnoses Final diagnoses:  Foreign body of external ear, right, initial encounter    Rx / DC Orders ED Discharge Orders     None      An After Visit Summary was printed and given to the patient.    [place],  Lonia Skinner, PA-C 12/16/23 2004    Durwin Glaze, MD 12/16/23 [phone]

## 2023-12-16 NOTE — ED Triage Notes (Signed)
Pt states he has an earring back stuck to is right ear. Pt denies pain.

## 2024-01-18 ENCOUNTER — Ambulatory Visit: Payer: Medicaid Other | Admitting: Nurse Practitioner

## 2024-01-24 ENCOUNTER — Emergency Department (HOSPITAL_COMMUNITY)
Admission: EM | Admit: 2024-01-24 | Discharge: 2024-01-24 | Discharge status: Against Medical Advice | Payer: Medicaid Other

## 2024-01-24 NOTE — ED Notes (Signed)
 Pt called x3 for rapid EKG. No answer

## 2024-02-06 ENCOUNTER — Ambulatory Visit: Payer: Medicaid Other | Admitting: Family

## 2024-10-01 ENCOUNTER — Emergency Department (HOSPITAL_COMMUNITY)

## 2024-10-01 ENCOUNTER — Other Ambulatory Visit: Payer: Self-pay

## 2024-10-01 ENCOUNTER — Emergency Department (HOSPITAL_COMMUNITY)
Admission: EM | Admit: 2024-10-01 | Discharge: 2024-10-01 | Disposition: A | Discharge status: Home / Self Care | Attending: Emergency Medicine | Admitting: Emergency Medicine

## 2024-10-01 DIAGNOSIS — S01511A Laceration without foreign body of lip, initial encounter: Secondary | ICD-10-CM | POA: Diagnosis present

## 2024-10-01 DIAGNOSIS — R42 Dizziness and giddiness: Secondary | ICD-10-CM | POA: Insufficient documentation

## 2024-10-01 DIAGNOSIS — Y9355 Activity, bike riding: Secondary | ICD-10-CM | POA: Insufficient documentation

## 2024-10-01 DIAGNOSIS — S01412A Laceration without foreign body of left cheek and temporomandibular area, initial encounter: Secondary | ICD-10-CM | POA: Diagnosis not present

## 2024-10-01 DIAGNOSIS — S0181XA Laceration without foreign body of other part of head, initial encounter: Secondary | ICD-10-CM

## 2024-10-01 LAB — CBC WITH DIFFERENTIAL/PLATELET
Abs Immature Granulocytes: 0.05 K/uL (ref 0.00–0.07)
Basophils Absolute: 0.1 K/uL (ref 0.0–0.1)
Basophils Relative: 0 %
Eosinophils Absolute: 0 K/uL (ref 0.0–0.5)
Eosinophils Relative: 0 %
HCT: 46.9 % (ref 39.0–52.0)
Hemoglobin: 15.3 g/dL (ref 13.0–17.0)
Immature Granulocytes: 0 %
Lymphocytes Relative: 23 %
Lymphs Abs: 3.1 K/uL (ref 0.7–4.0)
MCH: 28 pg (ref 26.0–34.0)
MCHC: 32.6 g/dL (ref 30.0–36.0)
MCV: 85.9 fL (ref 80.0–100.0)
Monocytes Absolute: 0.9 K/uL (ref 0.1–1.0)
Monocytes Relative: 6 %
Neutro Abs: 9.4 K/uL — ABNORMAL HIGH (ref 1.7–7.7)
Neutrophils Relative %: 71 %
Platelets: 258 K/uL (ref 150–400)
RBC: 5.46 MIL/uL (ref 4.22–5.81)
RDW: 13.7 % (ref 11.5–15.5)
WBC: 13.5 K/uL — ABNORMAL HIGH (ref 4.0–10.5)
nRBC: 0 % (ref 0.0–0.2)

## 2024-10-01 LAB — I-STAT CHEM 8, ED
BUN: 7 mg/dL (ref 6–20)
Calcium, Ion: 1.06 mmol/L — ABNORMAL LOW (ref 1.15–1.40)
Chloride: 103 mmol/L (ref 98–111)
Creatinine, Ser: 1.4 mg/dL — ABNORMAL HIGH (ref 0.61–1.24)
Glucose, Bld: 116 mg/dL — ABNORMAL HIGH (ref 70–99)
HCT: 49 % (ref 39.0–52.0)
Hemoglobin: 16.7 g/dL (ref 13.0–17.0)
Potassium: 3.2 mmol/L — ABNORMAL LOW (ref 3.5–5.1)
Sodium: 142 mmol/L (ref 135–145)
TCO2: 20 mmol/L — ABNORMAL LOW (ref 22–32)

## 2024-10-01 LAB — TYPE AND SCREEN
ABO/RH(D): A POS
Antibody Screen: NEGATIVE

## 2024-10-01 LAB — ETHANOL: Alcohol, Ethyl (B): 178 mg/dL — ABNORMAL HIGH (ref <15)

## 2024-10-01 MED ORDER — SODIUM CHLORIDE 0.9 % IV BOLUS
1000.0000 mL | Freq: Once | INTRAVENOUS | Status: AC
  Administered 2024-10-01: 1000 mL via INTRAVENOUS

## 2024-10-01 MED ORDER — TETANUS-DIPHTH-ACELL PERTUSSIS 5-2-15.5 LF-MCG/0.5 IM SUSP: 0.5000 mL | Freq: Once | INTRAMUSCULAR | Status: DC

## 2024-10-01 MED ORDER — CEPHALEXIN 500 MG PO CAPS: 500.0000 mg | ORAL_CAPSULE | Freq: Four times a day (QID) | ORAL | 0 refills | Status: AC

## 2024-10-01 MED ORDER — LIDOCAINE-EPINEPHRINE (PF) 2 %-1:200000 IJ SOLN
10.0000 mL | Freq: Once | INTRAMUSCULAR | Status: DC
  Filled 2024-10-01: qty 20

## 2024-10-01 MED ORDER — LIDOCAINE HCL (PF) 1 % IJ SOLN
5.0000 mL | Freq: Once | INTRAMUSCULAR | Status: AC
  Administered 2024-10-01: 5 mL
  Filled 2024-10-01: qty 5
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
## 2024-10-01 NOTE — Discharge Instructions (Signed)
 The stitches can come out in about a week.  Antibacterial ointment may help on the wound.  You have been given 5 days of antibiotics to help prevent infection.

## 2024-10-01 NOTE — ED Notes (Signed)
 XR at bedside

## 2024-10-01 NOTE — ED Notes (Signed)
 CCMD called.

## 2024-10-01 NOTE — ED Notes (Signed)
 Patient taken to CT by TRN

## 2024-10-01 NOTE — ED Notes (Signed)
 MD at bedside to apply additional stitch
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
## 2024-10-01 NOTE — ED Notes (Signed)
 Patient dc by RN. Patient verbalizes understanding of instructions without additional questions. Ambulatory to lobby to be picked up by wife.
